# Patient Record
Sex: Female | Born: 1965 | Race: Black or African American | Hispanic: No | Marital: Married | Smoking: Never smoker
Health system: Southern US, Community
[De-identification: ages and names within clinical notes are randomized; demographics above are authoritative.]

## PROBLEM LIST (undated history)

## (undated) DIAGNOSIS — D869 Sarcoidosis, unspecified: Secondary | ICD-10-CM

## (undated) DIAGNOSIS — E785 Hyperlipidemia, unspecified: Secondary | ICD-10-CM

## (undated) DIAGNOSIS — M1990 Unspecified osteoarthritis, unspecified site: Secondary | ICD-10-CM

## (undated) DIAGNOSIS — I2510 Atherosclerotic heart disease of native coronary artery without angina pectoris: Secondary | ICD-10-CM

## (undated) DIAGNOSIS — G4730 Sleep apnea, unspecified: Secondary | ICD-10-CM

## (undated) DIAGNOSIS — E119 Type 2 diabetes mellitus without complications: Secondary | ICD-10-CM

## (undated) DIAGNOSIS — I10 Essential (primary) hypertension: Secondary | ICD-10-CM

## (undated) HISTORY — DX: Type 2 diabetes mellitus without complications: E11.9

## (undated) HISTORY — DX: Essential (primary) hypertension: I10

## (undated) HISTORY — DX: Sleep apnea, unspecified: G47.30

## (undated) HISTORY — DX: Sarcoidosis, unspecified: D86.9

## (undated) HISTORY — DX: Unspecified osteoarthritis, unspecified site: M19.90

## (undated) HISTORY — PX: HERNIA REPAIR: SHX51

## (undated) HISTORY — DX: Hyperlipidemia, unspecified: E78.5

---
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
## 2006-07-03 ENCOUNTER — Ambulatory Visit (HOSPITAL_COMMUNITY): Admission: RE | Admit: 2006-07-03 | Discharge: 2006-07-03 | Payer: Self-pay | Admitting: General Surgery

## 2007-01-09 ENCOUNTER — Ambulatory Visit: Payer: Self-pay | Admitting: Thoracic Surgery

## 2007-01-17 ENCOUNTER — Ambulatory Visit: Payer: Self-pay | Admitting: Thoracic Surgery

## 2007-01-17 ENCOUNTER — Ambulatory Visit (HOSPITAL_COMMUNITY): Admission: RE | Admit: 2007-01-17 | Discharge: 2007-01-17 | Payer: Self-pay | Admitting: Thoracic Surgery

## 2007-01-17 ENCOUNTER — Encounter: Payer: Self-pay | Admitting: Thoracic Surgery

## 2007-01-21 ENCOUNTER — Ambulatory Visit: Payer: Self-pay | Admitting: Thoracic Surgery

## 2007-02-12 ENCOUNTER — Encounter: Admission: RE | Admit: 2007-02-12 | Discharge: 2007-02-12 | Payer: Self-pay | Admitting: Thoracic Surgery

## 2007-02-12 ENCOUNTER — Ambulatory Visit: Payer: Self-pay | Admitting: Thoracic Surgery

## 2007-05-08 ENCOUNTER — Ambulatory Visit: Payer: Self-pay | Admitting: Thoracic Surgery

## 2007-11-10 ENCOUNTER — Ambulatory Visit: Payer: Self-pay | Admitting: Internal Medicine

## 2007-11-26 ENCOUNTER — Ambulatory Visit: Payer: Self-pay | Admitting: Internal Medicine

## 2008-02-18 ENCOUNTER — Ambulatory Visit: Payer: Self-pay | Admitting: Family Medicine

## 2008-07-16 IMAGING — CT CT CHEST W/ CM
2 series · 16 of 31 positions shown, 20 images · non-contrast
Comparison: none

REASON FOR EXAM: cough
COMMENTS:

[Series 2: soft tissue · axial · 0.64mm/px · z∈[-228,-148]mm · 3 of 54 slices shown]
[im 5/54  mediastinal]
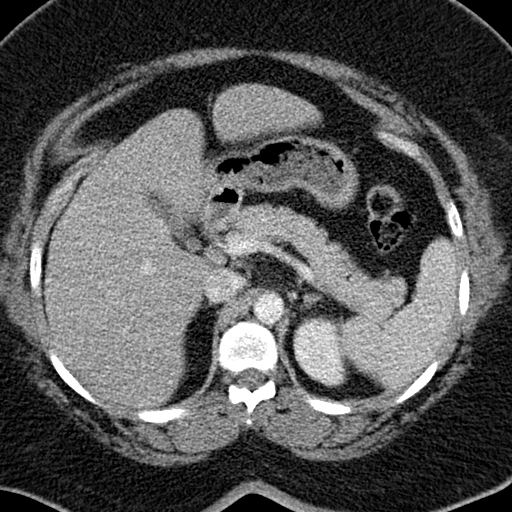
[im 13/54  mediastinal]
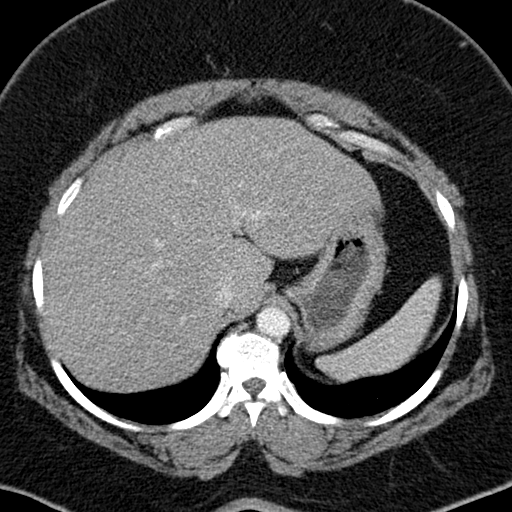
[im 21/54  mediastinal]
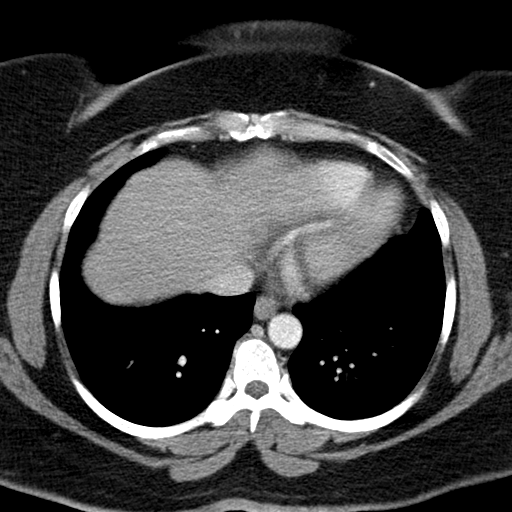

[Series 3: lung windows · axial · 0.64mm/px · z∈[-224,-4]mm · 13 of 52 slices shown, 17 images]
[im 4/52  mediastinal]
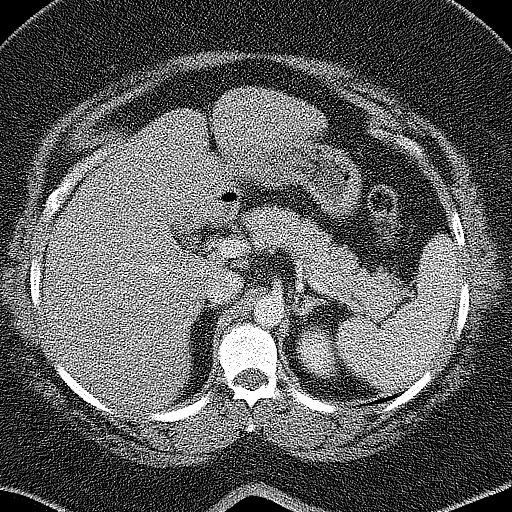
[im 4/52  lung]
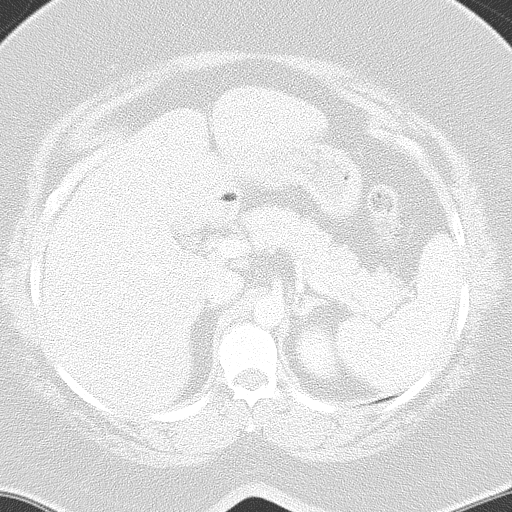
[im 8/52  lung]
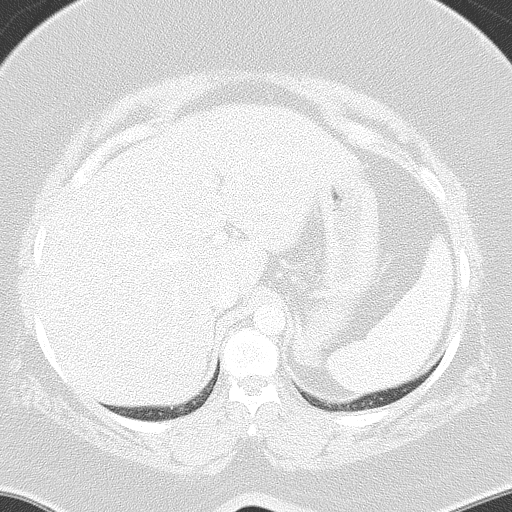
[im 12/52  lung]
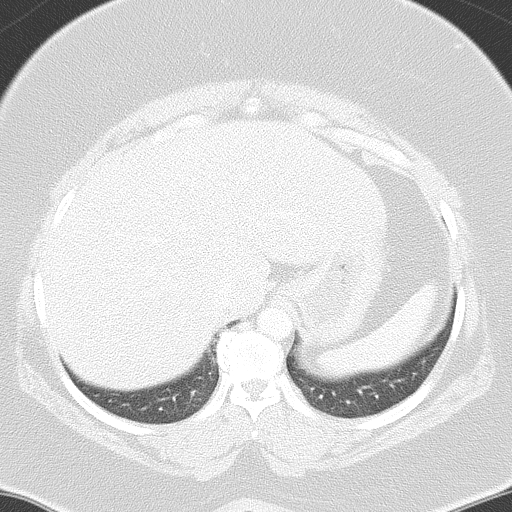
[im 16/52  lung]
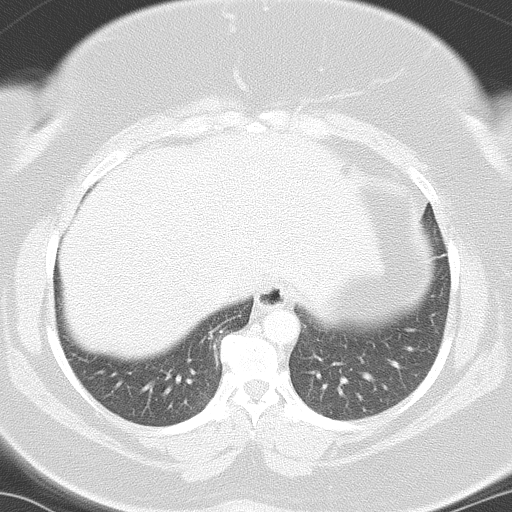
[im 20/52  mediastinal]
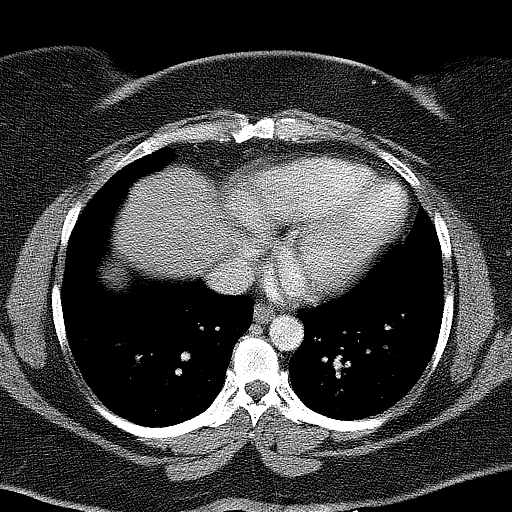
[im 20/52  lung]
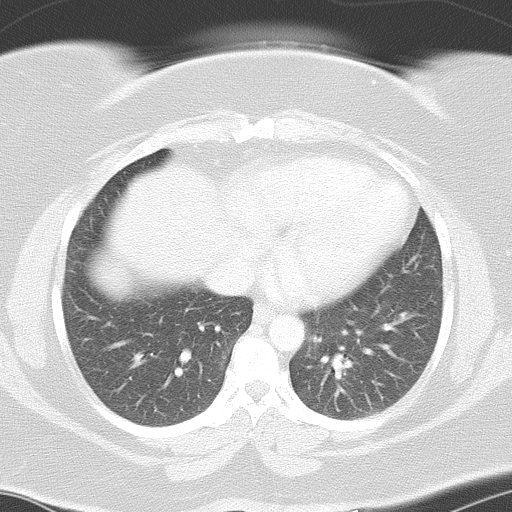
[im 24/52  lung]
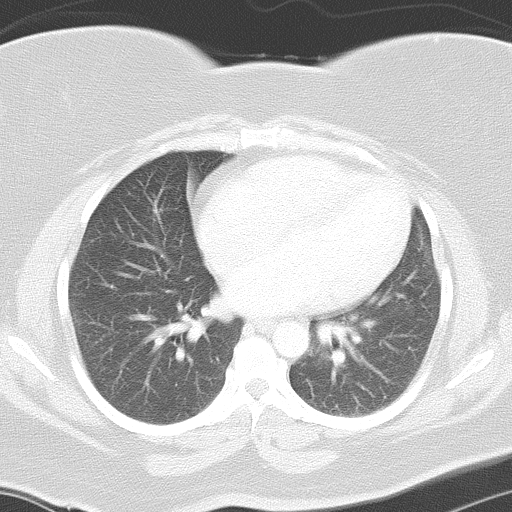
[im 26/52  lung]
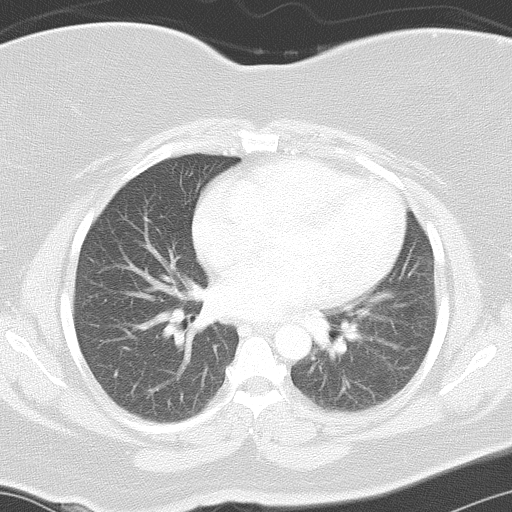
[im 28/52  lung]
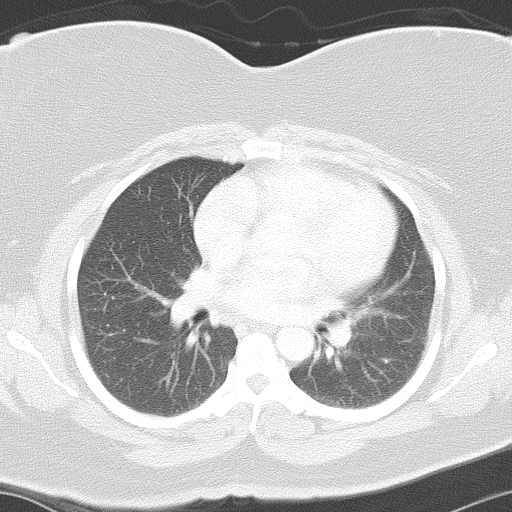
[im 32/52  mediastinal]
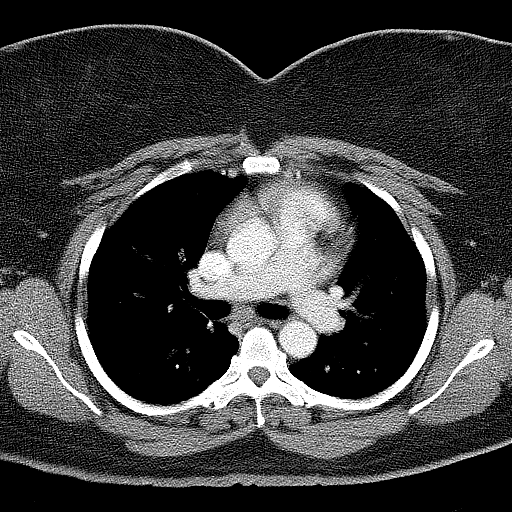
[im 32/52  lung]
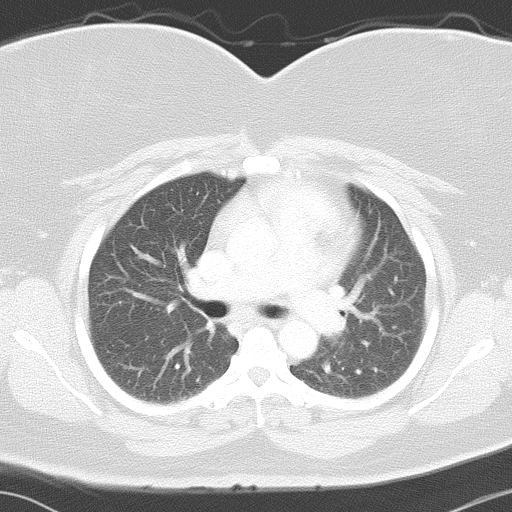
[im 36/52  lung]
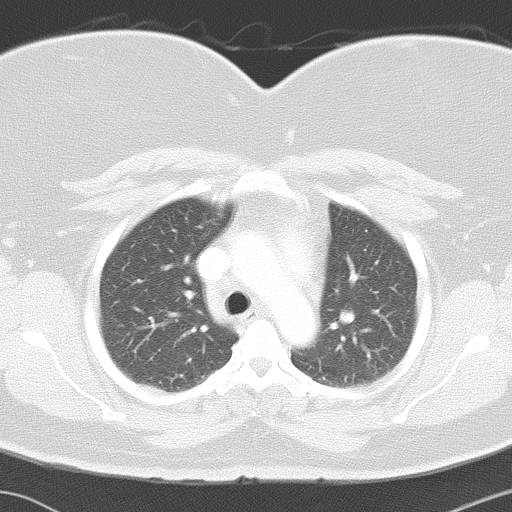
[im 40/52  lung]
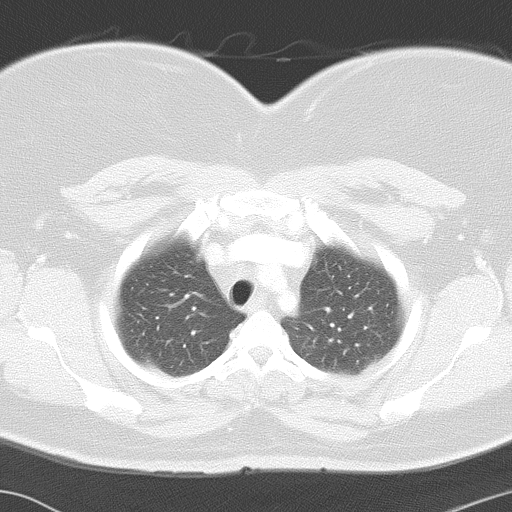
[im 44/52  lung]
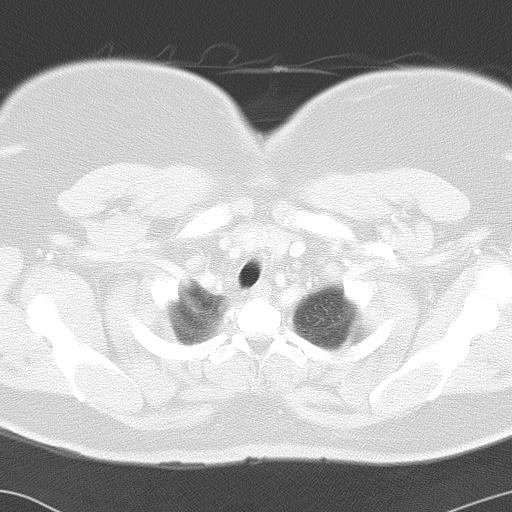
[im 48/52  mediastinal]
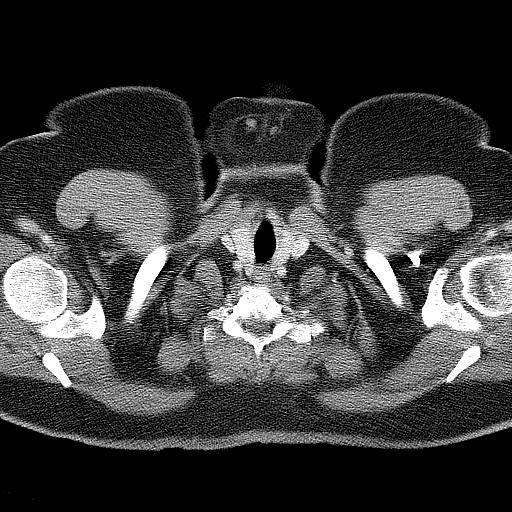
[im 48/52  lung]
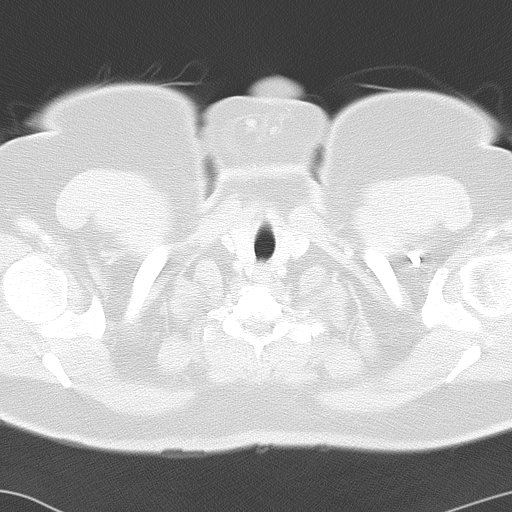

[16 of 31 positions shown; findings below may reference images not displayed]

PROCEDURE:     CT  - CT CHEST WITH CONTRAST  - November 26, 2007  [DATE]

RESULT:     Helical 5 mm sections were obtained from the thoracic inlet to
the lung bases status post intravenous administration of 100 ml of
Bsovue-V2F.

Evaluation of the mediastinum and hilar regions and structures demonstrates
no evidence of mediastinal nor hilar adenopathy nor masses. The lung
parenchyma demonstrates no evidence of focal infiltrates, effusions, edema,
masses or nodules.  The visualized upper abdominal viscera demonstrate no
gross abnormalities.
IMPRESSION: Unremarkable chest CT dated 11/26/2007.

## 2009-02-27 DIAGNOSIS — I2510 Atherosclerotic heart disease of native coronary artery without angina pectoris: Secondary | ICD-10-CM

## 2009-02-27 HISTORY — DX: Atherosclerotic heart disease of native coronary artery without angina pectoris: I25.10

## 2009-03-03 ENCOUNTER — Inpatient Hospital Stay (HOSPITAL_COMMUNITY): Admission: AD | Admit: 2009-03-03 | Discharge: 2009-03-08 | Payer: Self-pay | Admitting: Cardiology

## 2009-03-03 ENCOUNTER — Encounter: Payer: Self-pay | Admitting: Emergency Medicine

## 2009-03-28 ENCOUNTER — Encounter (HOSPITAL_COMMUNITY): Admission: RE | Admit: 2009-03-28 | Discharge: 2009-04-27 | Payer: Self-pay | Admitting: Cardiovascular Disease

## 2009-04-29 ENCOUNTER — Encounter (HOSPITAL_COMMUNITY): Admission: RE | Admit: 2009-04-29 | Discharge: 2009-05-29 | Payer: Self-pay | Admitting: Cardiovascular Disease

## 2009-05-30 ENCOUNTER — Encounter (HOSPITAL_COMMUNITY): Admission: RE | Admit: 2009-05-30 | Discharge: 2009-06-29 | Payer: Self-pay | Admitting: Cardiovascular Disease

## 2009-12-07 ENCOUNTER — Emergency Department (HOSPITAL_COMMUNITY): Admission: EM | Admit: 2009-12-07 | Discharge: 2009-12-07 | Payer: Self-pay | Admitting: Emergency Medicine

## 2010-11-04 LAB — COMPREHENSIVE METABOLIC PANEL
ALT: 31 U/L (ref 0–35)
Albumin: 3.4 g/dL — ABNORMAL LOW (ref 3.5–5.2)
Albumin: 3.6 g/dL (ref 3.5–5.2)
Alkaline Phosphatase: 89 U/L (ref 39–117)
Alkaline Phosphatase: 99 U/L (ref 39–117)
BUN: 14 mg/dL (ref 6–23)
Chloride: 106 mEq/L (ref 96–112)
Creatinine, Ser: 0.67 mg/dL (ref 0.4–1.2)
Potassium: 3.8 mEq/L (ref 3.5–5.1)
Potassium: 3.8 mEq/L (ref 3.5–5.1)
Sodium: 140 mEq/L (ref 135–145)
Total Bilirubin: 0.6 mg/dL (ref 0.3–1.2)
Total Protein: 6.7 g/dL (ref 6.0–8.3)
Total Protein: 7.2 g/dL (ref 6.0–8.3)

## 2010-11-04 LAB — CBC
HCT: 32.2 % — ABNORMAL LOW (ref 36.0–46.0)
HCT: 34.5 % — ABNORMAL LOW (ref 36.0–46.0)
HCT: 35.5 % — ABNORMAL LOW (ref 36.0–46.0)
HCT: 35.7 % — ABNORMAL LOW (ref 36.0–46.0)
HCT: 36.9 % (ref 36.0–46.0)
HCT: 38.4 % (ref 36.0–46.0)
HCT: 38.5 % (ref 36.0–46.0)
Hemoglobin: 11.7 g/dL — ABNORMAL LOW (ref 12.0–15.0)
Hemoglobin: 11.9 g/dL — ABNORMAL LOW (ref 12.0–15.0)
Hemoglobin: 12.4 g/dL (ref 12.0–15.0)
Hemoglobin: 12.9 g/dL (ref 12.0–15.0)
Hemoglobin: 13.2 g/dL (ref 12.0–15.0)
MCHC: 33.4 g/dL (ref 30.0–36.0)
MCHC: 33.6 g/dL (ref 30.0–36.0)
MCHC: 34.1 g/dL (ref 30.0–36.0)
MCHC: 34.5 g/dL (ref 30.0–36.0)
MCV: 92.7 fL (ref 78.0–100.0)
MCV: 93.4 fL (ref 78.0–100.0)
MCV: 93.7 fL (ref 78.0–100.0)
MCV: 94.1 fL (ref 78.0–100.0)
MCV: 94.2 fL (ref 78.0–100.0)
MCV: 94.2 fL (ref 78.0–100.0)
Platelets: 167 10*3/uL (ref 150–400)
Platelets: 185 10*3/uL (ref 150–400)
Platelets: 188 10*3/uL (ref 150–400)
Platelets: 204 10*3/uL (ref 150–400)
Platelets: 213 10*3/uL (ref 150–400)
Platelets: 215 10*3/uL (ref 150–400)
RBC: 4.14 MIL/uL (ref 3.87–5.11)
RDW: 12.8 % (ref 11.5–15.5)
RDW: 12.9 % (ref 11.5–15.5)
RDW: 12.9 % (ref 11.5–15.5)
RDW: 13 % (ref 11.5–15.5)
RDW: 13.1 % (ref 11.5–15.5)
WBC: 10.2 10*3/uL (ref 4.0–10.5)
WBC: 10.6 10*3/uL — ABNORMAL HIGH (ref 4.0–10.5)
WBC: 8.5 10*3/uL (ref 4.0–10.5)
WBC: 8.7 10*3/uL (ref 4.0–10.5)

## 2010-11-04 LAB — BASIC METABOLIC PANEL WITH GFR
CO2: 28 meq/L (ref 19–32)
Chloride: 104 meq/L (ref 96–112)
GFR calc Af Amer: >60 mL/min (ref >60)
Potassium: 3.9 meq/L (ref 3.5–5.1)
Sodium: 140 meq/L (ref 135–145)

## 2010-11-04 LAB — BASIC METABOLIC PANEL
BUN: 10 mg/dL (ref 6–23)
BUN: 11 mg/dL (ref 6–23)
BUN: 13 mg/dL (ref 6–23)
CO2: 26 mEq/L (ref 19–32)
CO2: 26 mEq/L (ref 19–32)
CO2: 29 mEq/L (ref 19–32)
Calcium: 9.6 mg/dL (ref 8.4–10.5)
Chloride: 107 mEq/L (ref 96–112)
Creatinine, Ser: 0.72 mg/dL (ref 0.4–1.2)
Creatinine, Ser: 0.75 mg/dL (ref 0.4–1.2)
GFR calc non Af Amer: >60 mL/min (ref >60)
GFR calc non Af Amer: >60 mL/min (ref >60)
Glucose, Bld: 102 mg/dL — ABNORMAL HIGH (ref 70–99)
Glucose, Bld: 110 mg/dL — ABNORMAL HIGH (ref 70–99)
Glucose, Bld: 135 mg/dL — ABNORMAL HIGH (ref 70–99)
Glucose, Bld: 96 mg/dL (ref 70–99)
Potassium: 3.8 mEq/L (ref 3.5–5.1)
Potassium: 3.9 mEq/L (ref 3.5–5.1)
Potassium: 3.9 mEq/L (ref 3.5–5.1)
Sodium: 141 mEq/L (ref 135–145)
Sodium: 142 mEq/L (ref 135–145)

## 2010-11-04 LAB — POCT CARDIAC MARKERS
CKMB, poc: 1.9 ng/mL (ref 1.0–8.0)
CKMB, poc: 17.8 ng/mL (ref 1.0–8.0)
CKMB, poc: 27.2 ng/mL (ref 1.0–8.0)
Myoglobin, poc: 144 ng/mL (ref 12–200)
Myoglobin, poc: 194 ng/mL (ref 12–200)
Myoglobin, poc: 240 ng/mL (ref 12–200)
Troponin i, poc: 0.24 ng/mL — ABNORMAL HIGH (ref 0.00–0.09)
Troponin i, poc: 0.88 ng/mL (ref 0.00–0.09)
Troponin i, poc: <0.05 ng/mL (ref 0.00–0.09)

## 2010-11-04 LAB — HEPARIN LEVEL (UNFRACTIONATED)
Heparin Unfractionated: 0.25 IU/mL — ABNORMAL LOW (ref 0.30–0.70)
Heparin Unfractionated: 0.45 IU/mL (ref 0.30–0.70)

## 2010-11-04 LAB — DIFFERENTIAL
Basophils Absolute: 0 10*3/uL (ref 0.0–0.1)
Basophils Absolute: 0 10*3/uL (ref 0.0–0.1)
Basophils Relative: 0 % (ref 0–1)
Basophils Relative: 0 % (ref 0–1)
Eosinophils Absolute: 0 10*3/uL (ref 0.0–0.7)
Eosinophils Absolute: 0.2 K/uL (ref 0.0–0.7)
Eosinophils Relative: 0 % (ref 0–5)
Eosinophils Relative: 2 % (ref 0–5)
Lymphocytes Relative: 15 % (ref 12–46)
Lymphs Abs: 1.3 10*3/uL (ref 0.7–4.0)
Monocytes Absolute: 0.3 10*3/uL (ref 0.1–1.0)
Monocytes Absolute: 0.5 K/uL (ref 0.1–1.0)
Monocytes Relative: 6 % (ref 3–12)
Neutro Abs: 6.6 K/uL (ref 1.7–7.7)
Neutrophils Relative %: 76 % (ref 43–77)

## 2010-11-04 LAB — CARDIAC PANEL(CRET KIN+CKTOT+MB+TROPI)
CK, MB: 39.6 ng/mL — ABNORMAL HIGH (ref 0.3–4.0)
Relative Index: 11.1 — ABNORMAL HIGH (ref 0.0–2.5)
Relative Index: 9 — ABNORMAL HIGH (ref 0.0–2.5)
Total CK: 337 U/L — ABNORMAL HIGH (ref 7–177)
Troponin I: 4.15 ng/mL (ref 0.00–0.06)

## 2010-11-04 LAB — APTT: aPTT: 64 seconds — ABNORMAL HIGH (ref 24–37)

## 2010-11-04 LAB — LIPID PANEL
Cholesterol: 184 mg/dL (ref 0–200)
HDL: 52 mg/dL (ref >39)
LDL Cholesterol: 118 mg/dL — ABNORMAL HIGH (ref 0–99)
Total CHOL/HDL Ratio: 3.5 RATIO

## 2010-11-04 LAB — PROTIME-INR
INR: 1 (ref 0.00–1.49)
INR: 1 (ref 0.00–1.49)

## 2010-11-04 LAB — BRAIN NATRIURETIC PEPTIDE: Pro B Natriuretic peptide (BNP): 35 pg/mL (ref 0.0–100.0)

## 2010-11-04 LAB — D-DIMER, QUANTITATIVE: D-Dimer, Quant: 0.43 ug/mL-FEU (ref 0.00–0.48)

## 2010-11-23 ENCOUNTER — Ambulatory Visit: Payer: Self-pay | Admitting: Family Medicine

## 2010-11-29 ENCOUNTER — Ambulatory Visit: Payer: Self-pay | Admitting: Family Medicine

## 2010-12-12 NOTE — Discharge Summary (Signed)
Glenda Burton, Glenda Burton              ACCOUNT NO.:  [id number]   MEDICAL RECORD NO.:  [id number]          PATIENT TYPE:  INP   LOCATION:  2508                         FACILITY:  [facility]   PHYSICIAN:  Antonieta Iba, MD   DATE OF BIRTH:  02/25/1966   DATE OF ADMISSION:  03/03/2009  DATE OF DISCHARGE:  03/08/2009                               DISCHARGE SUMMARY   DISCHARGE DIAGNOSES:  1. Non-ST elevation myocardial infarction.  2. Single vessel coronary artery disease status post cardiac      catheterization initially on March 04, 2009 by Dr. Nanetta Batty      showing a 95% left anterior descending.  She had a very tortuous      left anterior descending vessel.  It was not straightforward.  She      was heparinized over the weekend and her case was discussed with      Dr. Hazle Coca colleague and it was decided that an attempted      percutaneous coronary intervention would be done.  This was      performed by Dr. Tresa Endo on March 07, 2009.  The lesion appeared to      be significantly improved.  It was less than 50%.  Intracoronary      nitroglycerin was administered.  He tried to do fractional flow      rate.  However, because of her tortuous vessel, this was not able      to be done.  Thus, an intervention was not done and medical      treatment was advised.  3. History of sarcoidosis with prior biopsy by Dr. Edwyna Shell on January 17, 2007.  4. Chronic obstructive pulmonary disease or asthma, on Symbicort MDI.  5. History of hernia surgery.  6. History of arthritis.  7. Probable sleep apnea, unable to afford a sleep study because she      has no insurance.  8. Prior history of hypertension.  9. Morbid obesity with a BMI of 16.   LABORATORY DATA:  Sodium 141, potassium 3.9, BUN 10, creatinine 0.75,  and glucose 110.  Hemoglobin 10.8, hematocrit 32.2, WBCs 10.6, and  platelets 188.  Hemoglobin A1c was 5.6.  Total cholesterol is 184,  triglycerides 72, HDL 52, and LDL 118.  BNP was 35 on  March 03, 2009.  Point-of-care markers x1 was negative.  The second one showed a CK-MB of  17.8 and troponin of 0.24.  The third troponin CK-MB of 27 and troponin  of 0.88.  First CK-MB and troponin 358/39.6 with a troponin of 3.49,  number two was 337/30 with a troponin of 4.15, and number three was  275/19, and troponin of 3.34.  TSH was 0.867.  Chest x-ray on March 03, 2009 showed no active cardiopulmonary disease.   DISCHARGE MEDICATIONS:  1. Metoprolol 25 mg twice per day.  2. Imdur 60 mg a day.  3. Pravachol 40 mg at bedtime.  4. Naprosyn 500 mg b.i.d.  5. Symbicort 2 puffs daily.  6. Nitroglycerin 1/150 one sublingual every 5 minutes x3 as needed  for      chest pain.   FOLLOWUP:  She will follow up with Dr. Tresa Endo in [place] on March 17, 2009 at 1:45.  She was told not to do anything strenuous for 1 week and  not to drive for 1 week.   HOSPITAL COURSE:  [name] is a 45 year old African American who was  transferred from [facility] with complaints of chest pain.  She  was diagnosed with an non-ST elevation MI with elevated cardiac CK-MBs  and markers and troponins.  She was placed on heparin and the plan was  for cardiac catheterization.  This was performed on March 04, 2009 by  Dr. Allyson Sabal.  She had a high-grade LAD, but it was tortuous vessel.  She  was kept on heparin over the weekend and it was not a straightforward  case.  This case was discussed with his colleagues and it was decided to  attempt a PCI.  This was performed on March 07, 2009 by Dr. Nicki Guadalajara, however, the lesion was improved.  It was less than 50%.  He  tried to do a fractional flow rate, however, this could not be done  because of the tortuosity.  Medical treatment was advised.  She was seen  by a nutritionist because of her weight and also Cardiac Rehab.  On  March 08, 2009, she was seen by Dr. Garen Lah and considered stable for  discharge home.      Lezlie Octave,  N.P.      Antonieta Iba, MD  Electronically Signed    BB/MEDQ  D:  03/08/2009  T:  03/08/2009  Job:  [id number]

## 2010-12-12 NOTE — Letter (Signed)
January 21, 2007   Freda Munro, M.D.  [address]  [place], [place] [address]   Re:  CAMY, LEDER              DOB:  12-27-1965   Dear Dr. Welton Flakes,   I saw Ms. Hehl back after her mediastinoscopy, and we did both a  bronchoscopy and mediastinoscopy.  On bronchoscopy, there was evidence  of inflammation of the left main stem bronchus which the biopsies of  that as well as her mediastinal nodes showed noncaseating granulomas  consistent with sarcoid, so she does have lung involvement with her  sarcoidosis.  Her mediastinoscopy incision is well healed.  I will see  her back again in three weeks for further followup, and then refer her  to you for consideration of treatment.   Ines Bloomer, M.D.  Electronically Signed   DPB/MEDQ  D:  01/21/2007  T:  01/22/2007  Job:  [id number]

## 2010-12-12 NOTE — H&P (Signed)
HISTORY AND PHYSICAL EXAMINATION   January 10, 2007   Re:  Glenda, Burton        DOB:  [birth year]   Dear Dr. Welton Flakes:   I appreciate the opportunity of seeing Glenda Burton.  This 45 year old  African American female was evaluated for shortness of breath as well as  a 2 month cough.  A chest x-ray showed that she had extensive  mediastinal and hilar adenopathy.  She has had no hemoptysis, excessive  sputum.  She does not smoke.  She gets shortness of breath with  exertion.  There is no chest pain.  She has had no weight loss, no fever  or chills.  She is referred here for evaluation of her mediastinal  adenopathy.   PAST MEDICAL HISTORY:  She has no allergies.   MEDICATIONS:  Hydrocodone.  She has also been treated with  Avelox.   PAST SURGICAL HISTORY:  She has had a previous hernia repair.   SOCIAL HISTORY:  She works as a Psychiatric nurse .  She is married and has  2 children.  Does not smoke and does not drink.   FAMILY HISTORY:  Negative for vascular disease or cancer.   REVIEW OF SYSTEMS:  She is 268 pounds.  She is 5' 2.  CARDIAC: no angina  or atrial fibrillation.  PULMONARY:  See history of present illness.  GI:  No nausea, vomiting, constipation, or diarrhea.  GU:  No dysuria or frequent urination or kidney disease.  VASCULAR:  No claudication, DVT, TIA's.  NEUROLOGICAL:  No headaches, black out seizures, or dizziness.  MUSCULOSKELETAL:  No arthritis, joint pain, or rash.  PSYCHIATRIC:  No problems with depression or nervousness  ENT:  No change in her eye status or hearing.  HEMATOLOGICAL:  No problems with bleeding or clotting disorders or  anemia.   PHYSICAL EXAMINATION:  Blood pressure is 140/92.  Pulse 100.  Respirations 22.  SATs are 98%.  Head, eyes, ears, nose, and throat:  Unremarkable. Neck is supple with no thyromegaly.  There is no  supraclavicular axial adenopathy.  Chest: Clear to auscultation and  percussion.  No wheezing.  Heart:   Regular sinus rhythm with no murmur.  Abdomen:  Soft. There is no hepatosplenomegaly.  Extremities:  Pulses  are 2 plus.  There is no clubbing or edema.  Neurologic:  Intact.   I feel that from her chest x-ray, that sarcoidosis is the most logical  choice for diagnosis, but lymphoma needs to be ruled out as it is fairly  extensive adenopathy.  I have recommended that she get a bronchoscopy  and mediastinoscopy and we have scheduled it for her on the 17th at [facility].   I appreciate the opportunity of taking care of Glenda Burton.  I will let  you know our findings.   Sincerely,   Ines Bloomer, M.D.  Electronically Signed   DPB/MEDQ  D:  01/10/2007  T:  01/11/2007  Job:  [id number]   cc:   Freda Munro

## 2010-12-12 NOTE — Letter (Signed)
February 12, 2007   Glenda Burton  [address]  [place], [place] [address]   Re:  [name], [name]              DOB:  04-16-1966   Dear Dr. Welton Flakes:   I saw the patient back.  Her medial sternotomy incision is well-healed.  As you know she has sarcoidosis with fairly severe involvement since her  pulmonary tree was involved.  Apparently, you started her on prednisone.  Her blood pressure is 146/88, pulse 96, respirations 18, sats 96%.  From  my standpoint, she is doing well.  Her chest x-ray was stable.  I will  be happy to see her again if she has any future problems.   Ines Bloomer, M.D.  Electronically Signed   DPB/MEDQ  D:  02/12/2007  T:  02/13/2007  Job:  [id number]

## 2010-12-12 NOTE — Cardiovascular Report (Signed)
NAMECATERA, HANKINS              ACCOUNT NO.:  [id number]   MEDICAL RECORD NO.:  [id number]          PATIENT TYPE:  INP   LOCATION:  2910                         FACILITY:  [facility]   PHYSICIAN:  Nanetta Batty, M.D.   DATE OF BIRTH:  September 08, 1965   DATE OF PROCEDURE:  03/04/2009  DATE OF DISCHARGE:                            CARDIAC CATHETERIZATION   Ms. Totzke is a 45 year old morbidly overweight African American  female, admitted with a non-ST segment elevation myocardial infarction  on August 5.  Other problems include COPD, sarcoidosis, and probable  sleep apnea.  She had nonspecific ST and T-wave changes and positive CK-  MBs.  She presents now for diagnostic coronary arteriography, which was  initially set up via the right radial approach because of her body  habitus and groin anatomy.   DESCRIPTION OF PROCEDURE:  The patient was brought to the second floor  [facility] in the postabsorptive state.  She was  premedicated with p.o. Valium, IV Versed and fentanyl.  Her right radial  and right femoral arteries were prepped and shaved in usual sterile  fashion.  Xylocaine 1% was used for local anesthesia.  A 5-French Terumo  sheath was inserted into the right radial artery using standard back  wall pullback technique without difficulty.  The patient received 4000  units of heparin intravenously followed by a total of 7.5 mL of radial  antispasmodic cocktail.  Attempts were made to perform angiography  using a Tiger catheter and JL3.5; however, because of the patient's arch  anatomy, I was unable to engage the left main with a Tiger or get a  Jacky catheter or FL3.5 into the ascending aorta.  This approach was  then abandoned, and the right femoral approach was used using a 5-French  sheath.  A 5-French left to right Judkins diagnostic catheter as well as  5-French pigtail catheter were used for selective coronary angiography,  left ventriculography, subselective left  internal mammary artery  angiography, and distal abdominal aortography.  Visipaque dye was used  for the entirety of the case.  Retrograde aortic, left ventricular, and  pullback pressures were recorded.   HEMODYNAMICS:  1. Aortic systolic pressure 122, diastolic pressure 86.  2. Left ventricular systolic pressure 119, end-diastolic pressure 20.   SELECTIVE CORONARY ANGIOGRAPHY:  1. Left main normal.  The left main was a long vessel.  2. LAD; LAD had marked tortuosity especially in the proximal and      distal segments.  There was a 95% segmental stenosis at the      junction of the mid and distal LAD on a bend.  The LAD gave off a      small diagonal branch.  3. Left circumflex; dominant with marked tortuosity and free of      significant disease.  There were 2 ramus branches noted that were      moderate in size and free of significant disease.  4. Right coronary artery nondominant and free of significant disease.  5. Left ventriculography; RAO left ventriculogram was performed using      25 mL of  Visipaque dye at 12 mL per second.  The overall LVEF was      estimated greater than 60% without focal wall motion abnormalities.  6. Left internal mammary artery; this vessel subselectively visualized      was widely patent.  Suitable for use during coronary bypass      grafting if necessary.  7. Distal abdominal aortography; distal abdominal aortogram was      performed using 25 mL of Visipaque dye at 20 mL per second.  The      renal arteries are widely patent.  Infrarenal abdominal aorta and      iliac bifurcation were free of significant atherosclerotic changes.   IMPRESSION:  Ms. Crass has high-grade distal left anterior descending  disease with marked tortuosity proximal to this.  I suspect this would  be a technically difficult case, and I am going to review the angiograms  with my colleagues prior to making recommendation with regards to  medical therapy versus PCI and  stenting.   The right radial sheath was removed, and a TR Band was placed on the  wrist with 13 mL of air.  This achieved hemostasis.  The right femoral  sheath was removed, and pressure was held on the groin and achieved  hemostasis after an ACT was measured.  The patient left the lab in  stable condition.      Nanetta Batty, M.D.  Electronically Signed     JB/MEDQ  D:  03/04/2009  T:  03/04/2009  Job:  [id number]   cc:   Second Floor [facility]  [facility]

## 2010-12-12 NOTE — Cardiovascular Report (Signed)
NAMELATESSA, TILLIS              ACCOUNT NO.:  [id number]   MEDICAL RECORD NO.:  [id number]          PATIENT TYPE:  INP   LOCATION:  2508                         FACILITY:  [facility]   PHYSICIAN:  Nicki Guadalajara, M.D.     DATE OF BIRTH:  1966-07-30   DATE OF PROCEDURE:  DATE OF DISCHARGE:                            CARDIAC CATHETERIZATION   INDICATIONS:  [name] is a 45 year old female who has a his  history of significant obesity, sarcoidosis, sleep apnea.  She presented  to [facility] with chest pain and had mild  positive cardiac markers.  She became painfree, but ultimately was  transferred to [facility] on the evening of March 03, 2009.  She had nonspecific ST changes.  On March 04, 2009, she underwent  cardiac catheterization by Dr. Allyson Sabal, which showed a markedly tortuous  LAD especially in the proximal and distal segments.  There was a  suggestion of a mid distal 95% segmental stenosis at the junction of the  mid and distal LAD on sharp bend in the vessel.  The circumflex vessel  and ramus intermediate vessel were normal as well as the right coronary  artery, which was nondominant.  Due to dye load considerations since the  initial catheterization had been started with a radial approach, but was  unable to cannulate the left system through that approach.  The patient  was ultimately switched to a radial approach.  She was stabilized over  the weekend, hydrated.  She is now referred for possible intervention to  the LAD system.   PROCEDURE:  After premedication with Valium 2 mg plus fentanyl 50 mcg,  the patient was prepped and draped in usual fashion.  She was very obese  and the right femoral artery was punctured anteriorly and a 6-French  sheath was inserted.  Initially, an XB LAD 4.0 catheter was used, but  this was then exchanged for 6-French XB LAD 3.5 for optimal backup.  Multiple views of the left coronary system seem to suggest  there was  significant improvement in that mid distal LAD lesion on a tortuous bend  in the vessel, which was felt to be 90-95% on March 04, 2009.  In most  views today, this appeared to be less than 50%.  IC nitroglycerin 200  mcg was administered.  At this point, the decision was made rather than  to do intervention, but to verify the physiologic significance of this  lesion.  FFR was felt to provide this data.  As noted above, the vessel  was markedly tortuous.  At the start of the procedure, the patient had  received Angiomax and also had received Plavix 600 mg.  Initially, when  there was plans to do an intervention due to marked tortuosity, I was  going to use an extra-support wire or Asahi medium wire.  However, since  it was not totally sure an intervention was necessary, the FFR wire was  then inserted.  Unfortunately, this only comes in only one soft wire.  With some difficulty, the wire was able to cross the tortuous proximal  LAD segment, but was able to get into the LAD and cross the very  proximal segment, but this required the proximal and secondary bend on  this wire.  Ultimately, the wire was able to be advanced to the very  tortuous mid distal LAD lesion, but due to inadequate stiffness to the  wire was never able to cross this area of tortuosity.  At this point,  after some manipulation, the decision was made to discontinue the  procedure so as not to induce wire trauma to the area and potentially  make the lesion worse.  At the end of the procedure, the lesion looked  the same and the decision was made therefore to treat medically and not  intervene.  The patient tolerated the procedure well.  Hemostasis  obtained by direct manual pressure.   HEMODYNAMIC DATA:  Central aortic pressure is 126/78.   ANGIOGRAPHIC DATA:  Left main coronary was a large normal vessel, which  trifurcated into a very tortuous LAD system, a very large ramus  intermediate vessel, and a large  dominant left circumflex vessel.  The  LAD had a very sharp angle arising from the left main and then had  significant vessel tortuosity proximally with a large loop in the LAD.  In the mid distal LAD, there was marked tortuosity and in the area which  was felt to be 95% on the catheterization of August 6, most views  suggested this less than 50%.  In one view this perhaps at most 50-60%.  Again as noted above, FFR was attempted, but due to the softness of the  wire, the wire was always ultimately able to reach the lesion, but was  never able to go beyond the lesion such that an FFR was.  Although the  system had been normalized, was never able to be done distal to the  lesion.  Angiographically, the lesion appeared similar after the attempt  of FFR.  Medical therapy will be recommended.   IMPRESSION:  Single vessel coronary obstructive disease with improvement  in the previously noted 95% mid distal LAD lesion, now appeared less  than or equal to 50%.  Attempted fractional flow reserve was ultimately  discontinued since the wire was never able to cross this tortuous mid  distal LAD segment with the lesion.   RECOMMENDATIONS:  Medical therapy.           ______________________________  Nicki Guadalajara, M.D.     TK/MEDQ  D:  03/07/2009  T:  03/08/2009  Job:  [id number]

## 2010-12-12 NOTE — Op Note (Signed)
Glenda Burton, Glenda Burton              ACCOUNT NO.:  [id number]   MEDICAL RECORD NO.:  [id number]          PATIENT TYPE:  AMB   LOCATION:  SDS                          FACILITY:  [facility]   PHYSICIAN:  Ines Bloomer, M.D. DATE OF BIRTH:  05-Oct-1965   DATE OF PROCEDURE:  01/17/2007  DATE OF DISCHARGE:                               OPERATIVE REPORT   PREOPERATIVE DIAGNOSIS:  Mediastinal adenopathy.   POSTOPERATIVE DIAGNOSIS:  Mediastinal adenopathy.   OPERATION:  Fiberoptic bronchoscopy and mediastinoscopy.   After general anesthesia, the patient was prepped and draped in the  usual sterile manner. The video bronchoscope was passed through the  endotracheal tube.  The carina was in the midline.  There was a lot of  erythema and bronchitis. On the left side, there was a lot of  inflammation along the lining and multiple biopsies were taken of this  small nodular inflammation.  On the right side, there was a little  inflammation but much less. Cultures and washings were also taken.  The  video bronchoscope was removed.  The anterior neck was prepped and  draped in the usual sterile manner.  A transverse incision was made  above the sternal notch and carried down with electrocautery through the  subcutaneous tissue and fascia.  The pretracheal fascia was entered and  biopsies of 2R were done.  The strap muscles were closed with 2-0  Vicryl, subcutaneous tissue with 3-0 Vicryl and Dermabond for the skin.  The patient was returned to the recovery room in stable condition.      Ines Bloomer, M.D.  Electronically Signed     DPB/MEDQ  D:  01/17/2007  T:  01/17/2007  Job:  [id number]

## 2010-12-12 NOTE — H&P (Signed)
NAMESURIYAH, Glenda Burton              ACCOUNT NO.:  [id number]   MEDICAL RECORD NO.:  [id number]          PATIENT TYPE:  INP   LOCATION:  2910                         FACILITY:  [facility]   PHYSICIAN:  Sheliah Mends, MD      DATE OF BIRTH:  1966/03/22   DATE OF ADMISSION:  03/03/2009  DATE OF DISCHARGE:                              HISTORY & PHYSICAL   Glenda Burton is a 45 year old African American female patient who was  transferred from [facility] with complaints of chest pain.  Her  cardiac enzymes point of care markers were elevated.  Thus. she was  transferred here.  Her EKG did not show an acute MI.  Thus she is a non-  ST elevation MI.  She has no prior cardiac/coronary artery disease  history.  She states that she was at her daughter's house and this  morning about 8:30 a.m. she started having chest discomfort that  radiated to her bilateral arms.  She describes it as a tightness or an  achy feeling.  It was rated 8/10.  She does have chronic shortness of  breath because of sarcoidosis.  She may have had some mild increasing  shortness of breath.  She had no nausea, no vomiting and no diaphoresis.  The discomfort lasted for a few hours.  Her daughter apparently at a  similar time fell through a bathtub in the house and the EMS was called  and they were both taken to the hospital.  There at [facility]  apparently her first point of care markers were negative.  However, the  second set of point of care markers showed a CK-MB of 17.8 and a  troponin of 0.24.  She was placed on IV heparin and our service was  called for transfer.  She does state that on Sunday she started feeling  bad.  She felt weak but she had no chest pain and has not had any of  this chest pain previously.   PRIOR MEDICAL HISTORY:  1. Includes morbid obesity.  Her weight is 144 kg.  Her height is 5      feet 1 inch.  Her BMI is 60.  2. Hypertension.  3. Probable sleep apnea.  She was to have a sleep study,  but she has      no insurance.  4. She has some type of arthritis.  She does not know what type.  She      was on prednisone for close to a year.  She gained too much weight      and is now off of it and is on nonsteroidal anti-inflammatories.  5. COPD or asthma on Symbicort MDI.  6. She has had two C-sections.  7. Hernia surgery.  8. She has some sarcoidosis and has had a biopsy by Dr. Edwyna Shell for      mediastinal adenopathy on January 17, 2007.  Apparently per the note      she has severe involvement of her lungs.  Apparently her pulmonary      tree was involved per Dr. Scheryl Darter note.  MEDICATIONS:  1. Naprosyn 500 mg b.i.d.  2. Hydrochlorothiazide 12.5 mg once daily.  3. Symbicort inhaler, unknown dose 2 puffs b.i.d.   ALLERGIES:  No known drug allergies.   FAMILY HISTORY:  Mother is alive at age 9, has diabetes.  Father is  alive, has emphysema age 60.  She does have one brother who had an MI at  age 71.  She has four brothers all together and one twin sister.   SOCIAL HISTORY:  She is married and has two children.  She has applied  for disability.  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  However, she lost her job approximately 2 years ago when the  business was sent [place].  She has actually been going to school  taking one class at a time.  She does not smoke and does not drink any  alcohol.  She does not go to the store.  She is unable to now because  she is short of breath.   REVIEW OF SYSTEMS:  Positive leg discomfort, leg weakness, questionable  claudication.  No presyncope, no syncope.  Positive tachy palpitations  which she sits down for and will resolve.  Positive lower extremity  edema.  No nausea, no vomiting.  No anorexia,  no indigestion, no  heartburn.  No black tarry stools.  No dysuria.  No unilateral weakness.  No CVA symptoms.  Positive chest discomfort as described in HPI.  Other  systems are negative.   Blood pressure is 89/47.   Heart rate is 90-100, respirations of 22.  She  has O2 on.   LABORATORIES:  1. D-dimer was 0.43.  Sodium was 140, potassium 3.9, chloride 104, CO2      was 28, glucose 135, BUN 13, creatinine 0.72, hemoglobin was 13.2,      hematocrit 38.4.  WBCs 8.7 and platelets are 204.  Point of care      marker:  Troponin-I was less than 0.05.  CK-MB was 1.9.  2. CK-MB was 17.8, troponin 0.24.  3. CK-MB 27.2 and troponin of 0.88.  4. Chest x-ray showed no active cardiopulmonary disease.   EXAMINATION:  GENERAL:  No acute distress.  HEENT:  Pupils equal, round.  No thyromegaly, no carotid bruits and no  adenopathy.  She wears dentures, partial.  She states she has  approximately 10 teeth remaining.  She wears glasses.  Respirations are clear to auscultation bilaterally with good inspiratory  effort.  CARDIAC:  Heart sounds are rapid.  S1, S2 present.  No murmur or gallop  is noted.  ABDOMEN:  Obese.  No organomegaly could be detected.  Soft, nontender.  She does have 1+ lower extremity edema.  She has good dorsalis pedis  pulses.  She moves all extremities x4.  NEURO:  Alert and oriented x3.  SKIN:  Warm and dry.   ASSESSMENT:  1. Non-ST elevation myocardial infarction.  2. Morbid obesity.  3. Hypertension.  4. Sarcoidosis.  5. Unknown cholesterol.  6. Premature family history of heart disease.  7. Chronic obstructive pulmonary disease.  8. Probable sleep apnea.  Unable to afford a sleep study test.   PLAN:  1. She is admitted.  She is already on IV heparin from [facility].  2. Her blood pressure is too low at this time to add any cardiac      medications.  3. She will be placed on aspirin.  4. Plan is  to have her undergo cardiac catheterization.  Decision      about radial versus groin stick will be made in the morning.      Lezlie Octave, N.P.      Sheliah Mends, MD  Electronically Signed    BB/MEDQ  D:  03/03/2009  T:  03/03/2009  Job:  [phone]

## 2011-05-16 LAB — BASIC METABOLIC PANEL
BUN: 11
Calcium: 9.5
Chloride: 104
Creatinine, Ser: 0.71
GFR calc Af Amer: >60

## 2011-05-16 LAB — CULTURE, RESPIRATORY W GRAM STAIN

## 2011-05-16 LAB — TISSUE CULTURE: Culture: NO GROWTH

## 2011-05-16 LAB — CBC
MCHC: 33.3
MCV: 90.7
Platelets: 252
RBC: 4.33
WBC: 6

## 2011-05-16 LAB — AFB CULTURE WITH SMEAR (NOT AT ARMC)

## 2011-05-16 LAB — FUNGUS CULTURE W SMEAR: Fungal Smear: NONE SEEN

## 2011-05-16 LAB — PROTIME-INR
INR: 1
Prothrombin Time: 12.8

## 2011-05-16 LAB — TYPE AND SCREEN: ABO/RH(D): B POS

## 2011-08-07 ENCOUNTER — Ambulatory Visit: Payer: Self-pay | Admitting: Family Medicine

## 2013-01-05 ENCOUNTER — Ambulatory Visit: Payer: Self-pay | Admitting: Family Medicine

## 2013-01-12 ENCOUNTER — Ambulatory Visit: Payer: Self-pay | Admitting: Family Medicine

## 2013-08-04 ENCOUNTER — Ambulatory Visit: Payer: Self-pay | Admitting: Surgery

## 2014-08-12 ENCOUNTER — Ambulatory Visit: Payer: Self-pay | Admitting: Family Medicine

## 2017-10-07 ENCOUNTER — Telehealth: Payer: Self-pay

## 2017-10-07 ENCOUNTER — Encounter: Payer: Self-pay | Admitting: Internal Medicine

## 2017-10-07 ENCOUNTER — Ambulatory Visit: Payer: Medicare HMO | Admitting: Internal Medicine

## 2017-10-07 VITALS — BP 168/78 | HR 130 | Resp 16 | Ht 61.0 in | Wt 354.6 lb

## 2017-10-07 DIAGNOSIS — R0602 Shortness of breath: Secondary | ICD-10-CM | POA: Diagnosis not present

## 2017-10-07 DIAGNOSIS — G4733 Obstructive sleep apnea (adult) (pediatric): Secondary | ICD-10-CM | POA: Diagnosis not present

## 2017-10-07 DIAGNOSIS — D869 Sarcoidosis, unspecified: Secondary | ICD-10-CM

## 2017-10-07 DIAGNOSIS — R000 Tachycardia, unspecified: Secondary | ICD-10-CM | POA: Diagnosis not present

## 2017-10-07 DIAGNOSIS — Z9989 Dependence on other enabling machines and devices: Secondary | ICD-10-CM | POA: Diagnosis not present

## 2017-10-07 DIAGNOSIS — E119 Type 2 diabetes mellitus without complications: Secondary | ICD-10-CM | POA: Insufficient documentation

## 2017-10-07 DIAGNOSIS — J449 Chronic obstructive pulmonary disease, unspecified: Secondary | ICD-10-CM

## 2017-10-07 DIAGNOSIS — E785 Hyperlipidemia, unspecified: Secondary | ICD-10-CM | POA: Insufficient documentation

## 2017-10-07 DIAGNOSIS — J4489 Other specified chronic obstructive pulmonary disease: Secondary | ICD-10-CM

## 2017-10-07 DIAGNOSIS — M1990 Unspecified osteoarthritis, unspecified site: Secondary | ICD-10-CM | POA: Insufficient documentation

## 2017-10-07 DIAGNOSIS — I10 Essential (primary) hypertension: Secondary | ICD-10-CM | POA: Insufficient documentation

## 2017-10-07 DIAGNOSIS — G4730 Sleep apnea, unspecified: Secondary | ICD-10-CM | POA: Insufficient documentation

## 2017-10-07 NOTE — Patient Instructions (Signed)

## 2017-10-07 NOTE — Progress Notes (Signed)
[facility] [address] [place], [place] [address]  Pulmonary Sleep Medicine  Office Visit Note  Patient Name: Glenda Burton DOB: 01-07-1966 MRN [id number]  Date of Service: 10/07/2017  Complaints/HPI: She is doing OK overall. Her pressure was elevated she sees [facility] for her PCP. Patient states no admissions to the hospital. She had a Download done on her CPAP which shows 97% compliance. Has noted some increase in her SOB of late. Cough is noted and she states some sputum does come up at times. No chest pain noted. No fevers noted. Denies any hemoptysis  ROS  General: (-) fever, (-) chills, (-) night sweats, (-) weakness Skin: (-) rashes, (-) itching,. Eyes: (-) visual changes, (-) redness, (-) itching. Nose and Sinuses: (-) nasal stuffiness or itchiness, (-) postnasal drip, (-) nosebleeds, (-) sinus trouble. Mouth and Throat: (-) sore throat, (-) hoarseness. Neck: (-) swollen glands, (-) enlarged thyroid, (-) neck pain. Respiratory: + cough, (-) bloody sputum, + shortness of breath, - wheezing. Cardiovascular: - ankle swelling, (-) chest pain. Lymphatic: (-) lymph node enlargement. Neurologic: (-) numbness, (-) tingling. Psychiatric: (-) anxiety, (-) depression   Current Medication: Outpatient Encounter Medications as of 10/07/2017  Medication Sig  . Albuterol Sulfate (PROVENTIL HFA IN) Inhale 2 puffs into the lungs every other day as needed.  Marland Kitchen aspirin EC 81 MG tablet Take 81 mg by mouth daily.  . benzonatate (TESSALON) 100 MG capsule Take 100 mg by mouth 3 (three) times daily as needed for cough.  . cholecalciferol (VITAMIN D) 1000 units tablet Take 1,000 Units by mouth daily.  . Multiple Vitamin (DAILY VITE PO) Take by mouth.  Marland Kitchen atorvastatin (LIPITOR) 40 MG tablet   . fluticasone (FLONASE) 50 MCG/ACT nasal spray   . lisinopril (PRINIVIL,ZESTRIL) 20 MG tablet   . metoprolol tartrate (LOPRESSOR) 50 MG tablet   . nitroGLYCERIN (NITROSTAT) 0.4 MG SL  tablet    No facility-administered encounter medications on file as of 10/07/2017.     Surgical History: History reviewed. No pertinent surgical history.  Medical History: Past Medical History:  Diagnosis Date  . Arthritis   . Diabetes (HCC)   . Hyperlipidemia   . Hypertension   . Sarcoid   . Sleep apnea     Family History: Family History  Problem Relation Age of Onset  . Hypertension Mother   . Diabetes Mother   . COPD Father     Social History: Social History   Socioeconomic History  . Marital status: Married    Spouse name: Not on file  . Number of children: Not on file  . Years of education: Not on file  . Highest education level: Not on file  Social Needs  . Financial resource strain: Not on file  . Food insecurity - worry: Not on file  . Food insecurity - inability: Not on file  . Transportation needs - medical: Not on file  . Transportation needs - non-medical: Not on file  Occupational History  . Not on file  Tobacco Use  . Smoking status: Never Smoker  . Smokeless tobacco: Never Used  Substance and Sexual Activity  . Alcohol use: No    Frequency: Never  . Drug use: No  . Sexual activity: Not on file  Other Topics Concern  . Not on file  Social History Narrative  . Not on file    Vital Signs: Blood pressure (!) 168/78, pulse (!) 130, resp. rate 16, height 5\' 1"  (1.549 m), weight (!) 354  lb 9.6 oz (160.8 kg), SpO2 98 %.  Examination: General Appearance: The patient is well-developed, well-nourished, and in no distress. Skin: Gross inspection of skin unremarkable. Head: normocephalic, no gross deformities. Eyes: no gross deformities noted. ENT: ears appear grossly normal no exudates. Neck: Supple. No thyromegaly. No LAD. Respiratory: Scattered rhonchi. Cardiovascular: Normal S1 and S2 without murmur or rub. Extremities: No cyanosis. pulses are equal. Neurologic: Alert and oriented. No involuntary movements.  LABS: No results found for  this or any previous visit (from the past 2160 hour(s)).  Radiology: Mm Add Views Bil Br Bccp Scr ([facility] Hx)  Result Date: 08/12/2014 * PRIOR REPORT IMPORTED FROM AN EXTERNAL SYSTEM * CLINICAL DATA:  Screening. EXAM: DIGITAL SCREENING BILATERAL MAMMOGRAM WITH CAD COMPARISON:  Previous exam(s). ACR Breast Density Category b: There are scattered areas of fibroglandular density. FINDINGS: There are no findings suspicious for malignancy. Images were processed with CAD. IMPRESSION: No mammographic evidence of malignancy. A result letter of this screening mammogram will be mailed directly to the patient. RECOMMENDATION: Screening mammogram in one year. (Code:SM-B-01Y) BI-RADS CATEGORY  1: Negative. Electronically Signed   By: Gordan PaymentSteve  Reid M.D.   On: 08/12/2014 17:20     No results found.  No results found.    Assessment and Plan: Patient Active Problem List   Diagnosis Date Noted  . Arthritis 10/07/2017  . Diabetes (HCC) 10/07/2017  . Hyperlipidemia 10/07/2017  . Sarcoid 10/07/2017  . Sleep apnea 10/07/2017  . Hypertension 10/07/2017    1. COPD/Asthma She is on inhalers and will continue Also discussed f/u PFT due to history of Sarcoid also and increase in her symptoms  2. OSA She is on CPAP and has excellent compliance  3. Obesity Discussed wih her weight loss at length  4. Sarcoid Currently not on anything. Has increased SOB noted ?related to sarcoid She had a CT done 10 years ago unremarkable I would suggest as above f/u PFT and consider f/u CT chest  5. Tachycardia Unclear etiology would check ECG and also would followup with echo perhaps afterCT and PFT   General Counseling: I have discussed the findings of the evaluation and examination with [facility].  I have also discussed any further diagnostic evaluation thatmay be needed or ordered today. Glenda Burton verbalizes understanding of the findings of todays visit. We also reviewed her medications today and discussed drug interactions  and side effects including but not limited excessive drowsiness and altered mental states. We also discussed that there is always a risk not just to her but also people around her. she has been encouraged to call the office with any questions or concerns that should arise related to todays visit.    Time spent: 15min  I have personally obtained a history, examined the patient, evaluated laboratory and imaging results, formulated the assessment and plan and placed orders.    Yevonne PaxSaadat A Madiha Bambrick, MD [facility] Pulmonary and Critical Care Sleep medicine

## 2017-10-07 NOTE — Telephone Encounter (Signed)
Patient switching cpap supply company from [facility] and will [facility]. Dr Welton Flakeskhan gave pt new RX today for supplies/ BR

## 2017-10-15 ENCOUNTER — Telehealth: Payer: Self-pay

## 2017-10-15 NOTE — Telephone Encounter (Signed)
Left message advising patient that she is scheduled for her Ct chest on 10/23/17 @ 8:30 KP/Titania

## 2017-10-23 ENCOUNTER — Ambulatory Visit: Payer: Medicare HMO | Admitting: Internal Medicine

## 2017-10-23 ENCOUNTER — Ambulatory Visit
Admission: RE | Admit: 2017-10-23 | Discharge: 2017-10-23 | Disposition: A | Discharge status: Home / Self Care | Payer: Medicare HMO | Source: Ambulatory Visit | Attending: Internal Medicine | Admitting: Internal Medicine

## 2017-10-23 DIAGNOSIS — R0602 Shortness of breath: Secondary | ICD-10-CM | POA: Diagnosis not present

## 2017-10-23 DIAGNOSIS — D869 Sarcoidosis, unspecified: Secondary | ICD-10-CM | POA: Insufficient documentation

## 2017-10-23 MED ORDER — IOPAMIDOL (ISOVUE-370) INJECTION 76%
75.0000 mL | Freq: Once | INTRAVENOUS | Status: AC | PRN
  Administered 2017-10-23: 75 mL via INTRAVENOUS

## 2017-11-07 ENCOUNTER — Ambulatory Visit: Payer: Medicare HMO | Admitting: Internal Medicine

## 2017-11-07 ENCOUNTER — Encounter: Payer: Self-pay | Admitting: Internal Medicine

## 2017-11-07 VITALS — BP 132/98 | HR 89 | Ht 61.0 in | Wt 354.4 lb

## 2017-11-07 DIAGNOSIS — Z9989 Dependence on other enabling machines and devices: Secondary | ICD-10-CM | POA: Diagnosis not present

## 2017-11-07 DIAGNOSIS — R0602 Shortness of breath: Secondary | ICD-10-CM | POA: Diagnosis not present

## 2017-11-07 DIAGNOSIS — G4733 Obstructive sleep apnea (adult) (pediatric): Secondary | ICD-10-CM | POA: Diagnosis not present

## 2017-11-07 DIAGNOSIS — D869 Sarcoidosis, unspecified: Secondary | ICD-10-CM

## 2017-11-07 NOTE — Procedures (Signed)
[facility] [address] [place], [id number]  DATE OF SERVICE:  October 23, 2017  Complete Pulmonary Function Testing Interpretation:  FINDINGS:   the forced vital capacity is normal FEV1 is normal FEV1 FVC ratio is normal.  Post bronchodilator no significant improvement in the FEV1 however clinical improvement may occur in the absence of spirometer completed min.  Total in capacity is mildly decreased by body box and increased by nitrogen washout the residual volume is increased residual volume total lung capacity ratio is increased FRC is increased TLC is mildly decreased DLCO is mildly decreased  IMPRESSION:   this pulmonary function study shows evidence of restrictive lung disease likely related to the patient's morbid obesity.  This would be based on the body plethysmography that was done.  The nitrogen washout study shows increased lung volumes. Post bronchodilator there is no significant improvement in the FEV1 1 however clinical improvement may occur in the absence is pertinent improvement and does not preclude the use of bronchodilators DLCO is mildly decreased  Yevonne PaxSaadat A Khan, MD [facility] Pulmonary Critical Care Medicine Sleep Medicine

## 2017-11-07 NOTE — Patient Instructions (Signed)
Obesity, Adult Obesity is having too much body fat. If you have a BMI of 30 or more, you are obese. BMI is a number that explains how much body fat you have. Obesity is often caused by taking in (consuming) more calories than your body uses. Obesity can cause serious health problems. Changing your lifestyle can help to treat obesity. Follow these instructions at home: Eating and drinking   Follow advice from your doctor about what to eat and drink. Your doctor may tell you to: ? Cut down on (limit) fast foods, sweets, and processed snack foods. ? Choose low-fat options. For example, choose low-fat milk instead of whole milk. ? Eat 5 or more servings of fruits or vegetables every day. ? Eat at home more often. This gives you more control over what you eat. ? Choose healthy foods when you eat out. ? Learn what a healthy portion size is. A portion size is the amount of a certain food that is healthy for you to eat at one time. This is different for each person. ? Keep low-fat snacks available. ? Avoid sugary drinks. These include soda, fruit juice, iced tea that is sweetened with sugar, and flavored milk. ? Eat a healthy breakfast.  Drink enough water to keep your pee (urine) clear or pale yellow.  Do not go without eating for long periods of time (do not fast).  Do not go on popular or trendy diets (fad diets). Physical Activity  Exercise often, as told by your doctor. Ask your doctor: ? What types of exercise are safe for you. ? How often you should exercise.  Warm up and stretch before being active.  Do slow stretching after being active (cool down).  Rest between times of being active. Lifestyle  Limit how much time you spend in front of your TV, computer, or video game system (be less sedentary).  Find ways to reward yourself that do not involve food.  Limit alcohol intake to no more than 1 drink a day for nonpregnant women and 2 drinks a day for men. One drink equals 12 oz  of beer, 5 oz of wine, or 1 oz of hard liquor. General instructions  Keep a weight loss journal. This can help you keep track of: ? The food that you eat. ? The exercise that you do.  Take over-the-counter and prescription medicines only as told by your doctor.  Take vitamins and supplements only as told by your doctor.  Think about joining a support group. Your doctor may be able to help with this.  Keep all follow-up visits as told by your doctor. This is important. Contact a doctor if:  You cannot meet your weight loss goal after you have changed your diet and lifestyle for 6 weeks. This information is not intended to replace advice given to you by your health care provider. Make sure you discuss any questions you have with your health care provider. Document Released: 10/08/2011 Document Revised: 12/22/2015 Document Reviewed: 05/04/2015 Elsevier Interactive Patient Education  2018 [employer]  

## 2017-11-07 NOTE — Progress Notes (Signed)
[facility] [address] [place], [place] [id number]  Pulmonary Sleep Medicine   Office Visit Note  Patient Name: Glenda CatchingRegina W Burton DOB: 1966/03/07 MRN [id number]  Date of Service: 11/07/2017  Complaints/HPI:  She is doing fairly well here for follow-up after pulmonary functions were done the spirometry is actually within normal limits lung volumes were also within normal limits.  She has also had CT scan of the chest done which showed no active disease.  She is doing well with her sleep apnea has been using her CPAP device as prescribed with [facility].  I think at this point her main problem is really the issue with we can patient is to work aggressively on weight  ROS  General: (-) fever, (-) chills, (-) night sweats, (-) weakness Skin: (-) rashes, (-) itching,. Eyes: (-) visual changes, (-) redness, (-) itching. Nose and Sinuses: (-) nasal stuffiness or itchiness, (-) postnasal drip, (-) nosebleeds, (-) sinus trouble. Mouth and Throat: (-) sore throat, (-) hoarseness. Neck: (-) swollen glands, (-) enlarged thyroid, (-) neck pain. Respiratory: - cough, (-) bloody sputum, + shortness of breath, - wheezing. Cardiovascular: - ankle swelling, (-) chest pain. Lymphatic: (-) lymph node enlargement. Neurologic: (-) numbness, (-) tingling. Psychiatric: (-) anxiety, (-) depression   Current Medication: Outpatient Encounter Medications as of 11/07/2017  Medication Sig  . Albuterol Sulfate (PROVENTIL HFA IN) Inhale 2 puffs into the lungs every other day as needed.  Marland Kitchen. aspirin EC 81 MG tablet Take 81 mg by mouth daily.  Marland Kitchen. atorvastatin (LIPITOR) 40 MG tablet   . benzonatate (TESSALON) 100 MG capsule Take 100 mg by mouth 3 (three) times daily as needed for cough.  . cholecalciferol (VITAMIN D) 1000 units tablet Take 1,000 Units by mouth daily.  . fluticasone (FLONASE) 50 MCG/ACT nasal spray   . lisinopril (PRINIVIL,ZESTRIL) 20 MG tablet   . metoprolol tartrate (LOPRESSOR) 50 MG  tablet   . Multiple Vitamin (DAILY VITE PO) Take by mouth.  . nitroGLYCERIN (NITROSTAT) 0.4 MG SL tablet    No facility-administered encounter medications on file as of 11/07/2017.     Surgical History: Past Surgical History:  Procedure Laterality Date  . CESAREAN SECTION    . HERNIA REPAIR      Medical History: Past Medical History:  Diagnosis Date  . Arthritis   . Diabetes (HCC)   . Hyperlipidemia   . Hypertension   . Sarcoid   . Sleep apnea     Family History: Family History  Problem Relation Age of Onset  . Hypertension Mother   . Diabetes Mother   . COPD Father     Social History: Social History   Socioeconomic History  . Marital status: Married    Spouse name: Not on file  . Number of children: Not on file  . Years of education: Not on file  . Highest education level: Not on file  Occupational History  . Not on file  Social Needs  . Financial resource strain: Not on file  . Food insecurity:    Worry: Not on file    Inability: Not on file  . Transportation needs:    Medical: Not on file    Non-medical: Not on file  Tobacco Use  . Smoking status: Never Smoker  . Smokeless tobacco: Never Used  Substance and Sexual Activity  . Alcohol use: No    Frequency: Never  . Drug use: No  . Sexual activity: Not on file  Lifestyle  . Physical activity:  Days per week: Not on file    Minutes per session: Not on file  . Stress: Not on file  Relationships  . Social connections:    Talks on phone: Not on file    Gets together: Not on file    Attends religious service: Not on file    Active member of club or organization: Not on file    Attends meetings of clubs or organizations: Not on file    Relationship status: Not on file  . Intimate partner violence:    Fear of current or ex partner: Not on file    Emotionally abused: Not on file    Physically abused: Not on file    Forced sexual activity: Not on file  Other Topics Concern  . Not on file   Social History Narrative  . Not on file    Vital Signs: Blood pressure (!) 132/98, pulse 89, height 5\' 1"  (1.549 m), weight (!) 354 lb 6.4 oz (160.8 kg), SpO2 96 %.  Examination: General Appearance: The patient is well-developed, well-nourished, and in no distress. Skin: Gross inspection of skin unremarkable. Head: normocephalic, no gross deformities. Eyes: no gross deformities noted. ENT: ears appear grossly normal no exudates. Neck: Supple. No thyromegaly. No LAD. Respiratory: no rhonchi. Cardiovascular: Normal S1 and S2 without murmur or rub. Extremities: No cyanosis. pulses are equal. Neurologic: Alert and oriented. No involuntary movements.  LABS: No results found for this or any previous visit (from the past 2160 hour(s)).  Radiology: Ct Chest W Contrast  Result Date: 10/23/2017 CLINICAL DATA:  Cough, shortness of breath. EXAM: CT CHEST WITH CONTRAST TECHNIQUE: Multidetector CT imaging of the chest was performed during intravenous contrast administration. CONTRAST:  75mL ISOVUE-370 IOPAMIDOL (ISOVUE-370) INJECTION 76% COMPARISON:  Radiographs of March 03, 2009. CT scan of November 26, 2007. FINDINGS: Cardiovascular: No significant vascular findings. Normal heart size. No pericardial effusion. Mediastinum/Nodes: No enlarged mediastinal, hilar, or axillary lymph nodes. Thyroid gland, trachea, and esophagus demonstrate no significant findings. Lungs/Pleura: Lungs are clear. No pleural effusion or pneumothorax. Upper Abdomen: No acute abnormality. Musculoskeletal: No chest wall abnormality. No acute or significant osseous findings. IMPRESSION: No abnormality seen in the chest. Electronically Signed   By: Lupita Raider, M.D.   On: 10/23/2017 12:24    No results found.  Ct Chest W Contrast  Result Date: 10/23/2017 CLINICAL DATA:  Cough, shortness of breath. EXAM: CT CHEST WITH CONTRAST TECHNIQUE: Multidetector CT imaging of the chest was performed during intravenous contrast  administration. CONTRAST:  75mL ISOVUE-370 IOPAMIDOL (ISOVUE-370) INJECTION 76% COMPARISON:  Radiographs of March 03, 2009. CT scan of November 26, 2007. FINDINGS: Cardiovascular: No significant vascular findings. Normal heart size. No pericardial effusion. Mediastinum/Nodes: No enlarged mediastinal, hilar, or axillary lymph nodes. Thyroid gland, trachea, and esophagus demonstrate no significant findings. Lungs/Pleura: Lungs are clear. No pleural effusion or pneumothorax. Upper Abdomen: No acute abnormality. Musculoskeletal: No chest wall abnormality. No acute or significant osseous findings. IMPRESSION: No abnormality seen in the chest. Electronically Signed   By: Lupita Raider, M.D.   On: 10/23/2017 12:24      Assessment and Plan: Patient Active Problem List   Diagnosis Date Noted  . Arthritis 10/07/2017  . Diabetes (HCC) 10/07/2017  . Hyperlipidemia 10/07/2017  . Sarcoid 10/07/2017  . Sleep apnea 10/07/2017  . Hypertension 10/07/2017    1. Sarcoid stable at this time we will continue with present management no need for steroids 2. SOB I think it is combination of morbid obesity  deconditioning likely contributing to her shortness of breath.  Based on pulmonary function she is basically within normal range 3. OSA continues to show good compliance with her CPAP device which will be continued 4. Morbid obesity she was showing some interest in bariatric surgery and I referred back to primary care physician to discuss this further she would probably be a good candidate if she does not have any other comorbid conditions and from pulmonary perspective she is actually doing relatively well  General Counseling: I have discussed the findings of the evaluation and examination with Glenda Burton.  I have also discussed any further diagnostic evaluation thatmay be needed or ordered today. Glenda Burton verbalizes understanding of the findings of todays visit. We also reviewed her medications today and discussed drug  interactions and side effects including but not limited excessive drowsiness and altered mental states. We also discussed that there is always a risk not just to her but also people around her. she has been encouraged to call the office with any questions or concerns that should arise related to todays visit.    Time spent:  I have personally obtained a history, examined the patient, evaluated laboratory and imaging results, formulated the assessment and plan and placed orders.    Yevonne Pax, MD [facility] Pulmonary and Critical Care Sleep medicine

## 2017-12-05 ENCOUNTER — Other Ambulatory Visit: Payer: Self-pay | Admitting: Family Medicine

## 2017-12-05 DIAGNOSIS — Z1239 Encounter for other screening for malignant neoplasm of breast: Secondary | ICD-10-CM

## 2018-04-28 ENCOUNTER — Encounter: Payer: Self-pay | Admitting: Radiology

## 2018-04-28 ENCOUNTER — Ambulatory Visit
Admission: RE | Admit: 2018-04-28 | Discharge: 2018-04-28 | Disposition: A | Discharge status: Home / Self Care | Payer: Medicare HMO | Source: Ambulatory Visit | Attending: Family Medicine | Admitting: Family Medicine

## 2018-04-28 DIAGNOSIS — Z1231 Encounter for screening mammogram for malignant neoplasm of breast: Secondary | ICD-10-CM | POA: Diagnosis present

## 2018-04-28 DIAGNOSIS — Z1239 Encounter for other screening for malignant neoplasm of breast: Secondary | ICD-10-CM

## 2018-05-19 ENCOUNTER — Ambulatory Visit (INDEPENDENT_AMBULATORY_CARE_PROVIDER_SITE_OTHER): Payer: Medicare HMO | Admitting: Internal Medicine

## 2018-05-19 ENCOUNTER — Encounter: Payer: Self-pay | Admitting: Internal Medicine

## 2018-05-19 VITALS — BP 128/70 | HR 72 | Resp 16 | Ht 61.0 in | Wt 355.0 lb

## 2018-05-19 DIAGNOSIS — J449 Chronic obstructive pulmonary disease, unspecified: Secondary | ICD-10-CM | POA: Diagnosis not present

## 2018-05-19 DIAGNOSIS — G4733 Obstructive sleep apnea (adult) (pediatric): Secondary | ICD-10-CM

## 2018-05-19 DIAGNOSIS — Z9989 Dependence on other enabling machines and devices: Secondary | ICD-10-CM

## 2018-05-19 DIAGNOSIS — R0602 Shortness of breath: Secondary | ICD-10-CM | POA: Diagnosis not present

## 2018-05-19 DIAGNOSIS — D869 Sarcoidosis, unspecified: Secondary | ICD-10-CM | POA: Diagnosis not present

## 2018-05-19 NOTE — Progress Notes (Signed)
[facility] [address] [place], [place] [address]  Pulmonary Sleep Medicine   Office Visit Note  Patient Name: Glenda Burton DOB: [birth year] MRN [id number]  Date of Service: 05/19/2018  Complaints/HPI: Pt here for follow up on Sarcoid, and OSA.  She is generally doing well at this visit.  Her spirometry shows lung volumes that are within normal limits.  She denies any chest pain, sinus pain or pressure, palpitations or other issues.  Patient reports some mild dyspnea on exertion, which has been her norm for quite some time.  ROS  General: (-) fever, (-) chills, (-) night sweats, (-) weakness Skin: (-) rashes, (-) itching,. Eyes: (-) visual changes, (-) redness, (-) itching. Nose and Sinuses: (-) nasal stuffiness or itchiness, (-) postnasal drip, (-) nosebleeds, (-) sinus trouble. Mouth and Throat: (-) sore throat, (-) hoarseness. Neck: (-) swollen glands, (-) enlarged thyroid, (-) neck pain. Respiratory: - cough, (-) bloody sputum, - shortness of breath, + wheezing. Cardiovascular: - ankle swelling, (-) chest pain. Lymphatic: (-) lymph node enlargement. Neurologic: (-) numbness, (-) tingling. Psychiatric: (-) anxiety, (-) depression   Current Medication: Outpatient Encounter Medications as of 05/19/2018  Medication Sig  . Albuterol Sulfate (PROVENTIL HFA IN) Inhale 2 puffs into the lungs every other day as needed.  Marland Kitchen aspirin EC 81 MG tablet Take 81 mg by mouth daily.  Marland Kitchen atorvastatin (LIPITOR) 40 MG tablet   . benzonatate (TESSALON) 100 MG capsule Take 100 mg by mouth 3 (three) times daily as needed for cough.  . cholecalciferol (VITAMIN D) 1000 units tablet Take 1,000 Units by mouth daily.  . fluticasone (FLONASE) 50 MCG/ACT nasal spray   . lisinopril (PRINIVIL,ZESTRIL) 20 MG tablet   . metoprolol tartrate (LOPRESSOR) 50 MG tablet   . Multiple Vitamin (DAILY VITE PO) Take by mouth.  . nitroGLYCERIN (NITROSTAT) 0.4 MG SL tablet    No facility-administered  encounter medications on file as of 05/19/2018.     Surgical History: Past Surgical History:  Procedure Laterality Date  . CESAREAN SECTION    . HERNIA REPAIR      Medical History: Past Medical History:  Diagnosis Date  . Arthritis   . Diabetes (HCC)   . Hyperlipidemia   . Hypertension   . Sarcoid   . Sleep apnea   . Sleep apnea     Family History: Family History  Problem Relation Age of Onset  . Hypertension Mother   . Diabetes Mother   . COPD Father     Social History: Social History   Socioeconomic History  . Marital status: Married    Spouse name: Not on file  . Number of children: Not on file  . Years of education: Not on file  . Highest education level: Not on file  Occupational History  . Not on file  Social Needs  . Financial resource strain: Not on file  . Food insecurity:    Worry: Not on file    Inability: Not on file  . Transportation needs:    Medical: Not on file    Non-medical: Not on file  Tobacco Use  . Smoking status: Never Smoker  . Smokeless tobacco: Never Used  Substance and Sexual Activity  . Alcohol use: No    Frequency: Never  . Drug use: No  . Sexual activity: Not on file  Lifestyle  . Physical activity:    Days per week: Not on file    Minutes per session: Not on file  . Stress:  Not on file  Relationships  . Social connections:    Talks on phone: Not on file    Gets together: Not on file    Attends religious service: Not on file    Active member of club or organization: Not on file    Attends meetings of clubs or organizations: Not on file    Relationship status: Not on file  . Intimate partner violence:    Fear of current or ex partner: Not on file    Emotionally abused: Not on file    Physically abused: Not on file    Forced sexual activity: Not on file  Other Topics Concern  . Not on file  Social History Narrative  . Not on file    Vital Signs: Blood pressure 128/70, pulse 72, resp. rate 16, height 5\' 1"   (1.549 m), weight (!) 355 lb (161 kg), SpO2 97 %.  Examination: General Appearance: The patient is well-developed, well-nourished, and in no distress. Skin: Gross inspection of skin unremarkable. Head: normocephalic, no gross deformities. Eyes: no gross deformities noted. ENT: ears appear grossly normal no exudates. Neck: Supple. No thyromegaly. No LAD. Respiratory: Clear to auscultation. Cardiovascular: Normal S1 and S2 without murmur or rub. Extremities: No cyanosis. pulses are equal. Neurologic: Alert and oriented. No involuntary movements.  LABS: No results found for this or any previous visit (from the past 2160 hour(s)).  Radiology: Mm 3d Screen Breast Bilateral  Result Date: 04/28/2018 CLINICAL DATA:  Screening. EXAM: DIGITAL SCREENING BILATERAL MAMMOGRAM WITH TOMO AND CAD COMPARISON:  Previous exam(s). ACR Breast Density Category a: The breast tissue is almost entirely fatty. FINDINGS: There are no findings suspicious for malignancy. Images were processed with CAD. IMPRESSION: No mammographic evidence of malignancy. A result letter of this screening mammogram will be mailed directly to the patient. RECOMMENDATION: Screening mammogram in one year. (Code:SM-B-01Y) BI-RADS CATEGORY  1: Negative. Electronically Signed   By: Sande Brothers M.D.   On: 04/28/2018 15:28    No results found.  Mm 3d Screen Breast Bilateral  Result Date: 04/28/2018 CLINICAL DATA:  Screening. EXAM: DIGITAL SCREENING BILATERAL MAMMOGRAM WITH TOMO AND CAD COMPARISON:  Previous exam(s). ACR Breast Density Category a: The breast tissue is almost entirely fatty. FINDINGS: There are no findings suspicious for malignancy. Images were processed with CAD. IMPRESSION: No mammographic evidence of malignancy. A result letter of this screening mammogram will be mailed directly to the patient. RECOMMENDATION: Screening mammogram in one year. (Code:SM-B-01Y) BI-RADS CATEGORY  1: Negative. Electronically Signed   By: Sande Brothers M.D.   On: 04/28/2018 15:28      Assessment and Plan: Patient Active Problem List   Diagnosis Date Noted  . Arthritis 10/07/2017  . Diabetes (HCC) 10/07/2017  . Hyperlipidemia 10/07/2017  . Sarcoid 10/07/2017  . Sleep apnea 10/07/2017  . Hypertension 10/07/2017     1. Obstructive chronic bronchitis without exacerbation (HCC) COPD stable at this time continue with current management.  2. OSA on CPAP She continues to show good compliance with her CPAP.  She denies any issues wearing device.  Encouraged continued compliance with the machine, and discussed long-term effects of not using the machine.  3. Sarcoid Her sarcoid is stable at this time will continue with present management.  4. Morbid obesity (HCC) Many of patient's issues are exasperated by the fact that the patient is morbidly obese.  We discussed aggressive weight loss at this visit and we will continue to discuss at future visits. Obesity Counseling: Risk Assessment:  An assessment of behavioral risk factors was made today and includes lack of exercise sedentary lifestyle, lack of portion control and poor dietary habits.  Risk Modification Advice: She was counseled on portion control guidelines. Restricting daily caloric intake to. . The detrimental long term effects of obesity on her health and ongoing poor compliance was also discussed with the patient.  5. Shortness of breath Spira today showed FEV1/FVC pre-predicted percentage of 111%. - Spirometry with Graph  General Counseling: I have discussed the findings of the evaluation and examination with [facility].  I have also discussed any further diagnostic evaluation thatmay be needed or ordered today. Davonda verbalizes understanding of the findings of todays visit. We also reviewed her medications today and discussed drug interactions and side effects including but not limited excessive drowsiness and altered mental states. We also discussed that there is always a  risk not just to her but also people around her. she has been encouraged to call the office with any questions or concerns that should arise related to todays visit.    Time spent: 25 This patient was seen by Blima Ledger AGNP-C in Collaboration with Dr. Freda Munro as a part of collaborative care agreement.  I have personally obtained a history, examined the patient, evaluated laboratory and imaging results, formulated the assessment and plan and placed orders.    Yevonne Pax, MD [facility] Pulmonary and Critical Care Sleep medicine

## 2018-05-19 NOTE — Patient Instructions (Signed)

## 2018-11-24 ENCOUNTER — Other Ambulatory Visit: Payer: Self-pay

## 2018-11-24 ENCOUNTER — Ambulatory Visit (INDEPENDENT_AMBULATORY_CARE_PROVIDER_SITE_OTHER): Payer: Medicare HMO | Admitting: Adult Health

## 2018-11-24 ENCOUNTER — Encounter: Payer: Self-pay | Admitting: Adult Health

## 2018-11-24 VITALS — Ht 61.0 in | Wt 355.0 lb

## 2018-11-24 DIAGNOSIS — D869 Sarcoidosis, unspecified: Secondary | ICD-10-CM

## 2018-11-24 DIAGNOSIS — Z9989 Dependence on other enabling machines and devices: Secondary | ICD-10-CM

## 2018-11-24 DIAGNOSIS — G4733 Obstructive sleep apnea (adult) (pediatric): Secondary | ICD-10-CM | POA: Diagnosis not present

## 2018-11-24 DIAGNOSIS — J449 Chronic obstructive pulmonary disease, unspecified: Secondary | ICD-10-CM | POA: Diagnosis not present

## 2018-11-24 NOTE — Patient Instructions (Signed)

## 2018-11-24 NOTE — Progress Notes (Signed)
[facility] [address] [place], [place] [address]  Internal MEDICINE  Telephone Visit  Patient Name: Glenda Burton  [id number]  [id number]  Date of Service: 11/24/2018  I connected with the patient at 1215 by telephone and verified the patients identity using two identifiers.  I discussed the limitations, risks, security and privacy concerns of performing an evaluation and management service by telephone and the availability of in person appointments. I also discussed with the patient that there may be a patient responsible charge related to the service.  The patient expressed understanding and agrees to proceed.    Chief Complaint  Patient presents with  . Telephone Screen  . COPD    NO SOB, NO DIFFICULTIES   . Sleep Apnea    NEEDS SUPPLIES FOR CPAP   . Telephone Assessment    HPI  Pt checking in for pulmonary visit.  She reports here breathing has been doing well.  She has had no issues.  Denies fever,sob or hospitalizations since her last visit.  She reports excellent compliance with CPAP machine at night.  She is cleaning her machine as instructed.  She is also switching her mask seal and tubing as discussed.  She is in need of a new RX at this time for supplies.    Current Medication: Outpatient Encounter Medications as of 11/24/2018  Medication Sig  . Albuterol Sulfate (PROVENTIL HFA IN) Inhale 2 puffs into the lungs every other day as needed.  Marland Kitchen aspirin EC 81 MG tablet Take 81 mg by mouth daily.  Marland Kitchen atorvastatin (LIPITOR) 40 MG tablet   . cholecalciferol (VITAMIN D) 1000 units tablet Take 1,000 Units by mouth daily.  . fluticasone (FLONASE) 50 MCG/ACT nasal spray   . lisinopril (PRINIVIL,ZESTRIL) 20 MG tablet   . metoprolol tartrate (LOPRESSOR) 50 MG tablet   . Multiple Vitamin (DAILY VITE PO) Take by mouth.  . nitroGLYCERIN (NITROSTAT) 0.4 MG SL tablet   . [DISCONTINUED] benzonatate (TESSALON) 100 MG capsule Take 100 mg by mouth 3 (three) times daily as  needed for cough.   No facility-administered encounter medications on file as of 11/24/2018.     Surgical History: Past Surgical History:  Procedure Laterality Date  . CESAREAN SECTION    . HERNIA REPAIR      Medical History: Past Medical History:  Diagnosis Date  . Arthritis   . Diabetes (HCC)   . Hyperlipidemia   . Hypertension   . Sarcoid   . Sleep apnea   . Sleep apnea     Family History: Family History  Problem Relation Age of Onset  . Hypertension Mother   . Diabetes Mother   . COPD Father     Social History   Socioeconomic History  . Marital status: Married    Spouse name: Not on file  . Number of children: Not on file  . Years of education: Not on file  . Highest education level: Not on file  Occupational History  . Not on file  Social Needs  . Financial resource strain: Not on file  . Food insecurity:    Worry: Not on file    Inability: Not on file  . Transportation needs:    Medical: Not on file    Non-medical: Not on file  Tobacco Use  . Smoking status: Never Smoker  . Smokeless tobacco: Never Used  Substance and Sexual Activity  . Alcohol use: No    Frequency: Never  . Drug use: No  . Sexual  activity: Not on file  Lifestyle  . Physical activity:    Days per week: Not on file    Minutes per session: Not on file  . Stress: Not on file  Relationships  . Social connections:    Talks on phone: Not on file    Gets together: Not on file    Attends religious service: Not on file    Active member of club or organization: Not on file    Attends meetings of clubs or organizations: Not on file    Relationship status: Not on file  . Intimate partner violence:    Fear of current or ex partner: Not on file    Emotionally abused: Not on file    Physically abused: Not on file    Forced sexual activity: Not on file  Other Topics Concern  . Not on file  Social History Narrative  . Not on file      Review of Systems  Constitutional: Negative  for chills, fatigue and unexpected weight change.  HENT: Negative for congestion, rhinorrhea, sneezing and sore throat.   Eyes: Negative for photophobia, pain and redness.  Respiratory: Negative for cough, chest tightness and shortness of breath.   Cardiovascular: Negative for chest pain and palpitations.  Gastrointestinal: Negative for abdominal pain, constipation, diarrhea, nausea and vomiting.  Endocrine: Negative.   Genitourinary: Negative for dysuria and frequency.  Musculoskeletal: Negative for arthralgias, back pain, joint swelling and neck pain.  Skin: Negative for rash.  Allergic/Immunologic: Negative.   Neurological: Negative for tremors and numbness.  Hematological: Negative for adenopathy. Does not bruise/bleed easily.  Psychiatric/Behavioral: Negative for behavioral problems and sleep disturbance. The patient is not nervous/anxious.     Vital Signs: Ht 5\' 1"  (1.549 m)   Wt (!) 355 lb (161 kg)   BMI 67.08 kg/m    Observation/Objective: Well sounds, speaking in full sentences with nad noted.    Assessment/Plan: 1. OSA on CPAP Stable continue present therapy. RX for supplies placed.   2. Sarcoid Stable, continue present management.   3. Obstructive chronic bronchitis without exacerbation (HCC) Stable, continue to use inhalers as needed.   4. Morbid obesity (HCC) Obesity Counseling: Risk Assessment: An assessment of behavioral risk factors was made today and includes lack of exercise sedentary lifestyle, lack of portion control and poor dietary habits.  Risk Modification Advice: She was counseled on portion control guidelines. Restricting daily caloric intake to. . The detrimental long term effects of obesity on her health and ongoing poor compliance was also discussed with the patient.    General Counseling: Glenda Burton verbalizes understanding of the findings of today's phone visit and agrees with plan of treatment. I have discussed any further diagnostic  evaluation that may be needed or ordered today. We also reviewed her medications today. she has been encouraged to call the office with any questions or concerns that should arise related to todays visit.    No orders of the defined types were placed in this encounter.   No orders of the defined types were placed in this encounter.   Time spent:11 Minutes    Blima LedgerAdam Jenisis Harmsen AGNP-C Internal medicine

## 2018-11-25 ENCOUNTER — Telehealth: Payer: Self-pay

## 2018-11-25 NOTE — Telephone Encounter (Signed)
Gave [facility] RX for cpap supplies. Glenda Burton

## 2019-02-19 ENCOUNTER — Encounter: Payer: Self-pay | Admitting: Internal Medicine

## 2019-02-19 ENCOUNTER — Ambulatory Visit: Payer: Medicare HMO | Admitting: Internal Medicine

## 2019-02-19 ENCOUNTER — Other Ambulatory Visit: Payer: Self-pay

## 2019-02-19 VITALS — BP 131/70 | HR 81 | Ht 61.0 in | Wt 348.0 lb

## 2019-02-19 DIAGNOSIS — G4733 Obstructive sleep apnea (adult) (pediatric): Secondary | ICD-10-CM

## 2019-02-19 DIAGNOSIS — D869 Sarcoidosis, unspecified: Secondary | ICD-10-CM

## 2019-02-19 DIAGNOSIS — J449 Chronic obstructive pulmonary disease, unspecified: Secondary | ICD-10-CM

## 2019-02-19 DIAGNOSIS — R0602 Shortness of breath: Secondary | ICD-10-CM

## 2019-02-19 DIAGNOSIS — Z9989 Dependence on other enabling machines and devices: Secondary | ICD-10-CM

## 2019-02-19 NOTE — Progress Notes (Signed)
[facility] [address] [place], [place] [address]  Pulmonary Sleep Medicine   Office Visit Note  Patient Name: Glenda Burton DOB: Mar 05, 1966 MRN [id number]  Date of Service: 02/19/2019  Complaints/HPI: PT is here for follow up on OSA, COPD, and sarcoid.  She reports overall she has been doing well.  Denies any overt sob. No recent illness or hospitalizations.    ROS  General: (-) fever, (-) chills, (-) night sweats, (-) weakness Skin: (-) rashes, (-) itching,. Eyes: (-) visual changes, (-) redness, (-) itching. Nose and Sinuses: (-) nasal stuffiness or itchiness, (-) postnasal drip, (-) nosebleeds, (-) sinus trouble. Mouth and Throat: (-) sore throat, (-) hoarseness. Neck: (-) swollen glands, (-) enlarged thyroid, (-) neck pain. Respiratory: - cough, (-) bloody sputum, - shortness of breath, - wheezing. Cardiovascular: - ankle swelling, (-) chest pain. Lymphatic: (-) lymph node enlargement. Neurologic: (-) numbness, (-) tingling. Psychiatric: (-) anxiety, (-) depression   Current Medication: Outpatient Encounter Medications as of 02/19/2019  Medication Sig  . Albuterol Sulfate (PROVENTIL HFA IN) Inhale 2 puffs into the lungs every other day as needed.  Marland Kitchen. aspirin EC 81 MG tablet Take 81 mg by mouth daily.  Marland Kitchen. atorvastatin (LIPITOR) 40 MG tablet   . cholecalciferol (VITAMIN D) 1000 units tablet Take 1,000 Units by mouth daily.  . fluticasone (FLONASE) 50 MCG/ACT nasal spray   . lisinopril (PRINIVIL,ZESTRIL) 20 MG tablet   . metoprolol tartrate (LOPRESSOR) 50 MG tablet   . Multiple Vitamin (DAILY VITE PO) Take by mouth.  . nitroGLYCERIN (NITROSTAT) 0.4 MG SL tablet   . NON FORMULARY cpap device   No facility-administered encounter medications on file as of 02/19/2019.     Surgical History: Past Surgical History:  Procedure Laterality Date  . CESAREAN SECTION    . HERNIA REPAIR      Medical History: Past Medical History:  Diagnosis Date  . Arthritis    . Diabetes (HCC)   . Hyperlipidemia   . Hypertension   . Sarcoid   . Sleep apnea   . Sleep apnea     Family History: Family History  Problem Relation Age of Onset  . Hypertension Mother   . Diabetes Mother   . COPD Father     Social History: Social History   Socioeconomic History  . Marital status: Married    Spouse name: Not on file  . Number of children: Not on file  . Years of education: Not on file  . Highest education level: Not on file  Occupational History  . Not on file  Social Needs  . Financial resource strain: Not on file  . Food insecurity    Worry: Not on file    Inability: Not on file  . Transportation needs    Medical: Not on file    Non-medical: Not on file  Tobacco Use  . Smoking status: Never Smoker  . Smokeless tobacco: Never Used  Substance and Sexual Activity  . Alcohol use: No    Frequency: Never  . Drug use: No  . Sexual activity: Not on file  Lifestyle  . Physical activity    Days per week: Not on file    Minutes per session: Not on file  . Stress: Not on file  Relationships  . Social Musicianconnections    Talks on phone: Not on file    Gets together: Not on file    Attends religious service: Not on file    Active member of club  or organization: Not on file    Attends meetings of clubs or organizations: Not on file    Relationship status: Not on file  . Intimate partner violence    Fear of current or ex partner: Not on file    Emotionally abused: Not on file    Physically abused: Not on file    Forced sexual activity: Not on file  Other Topics Concern  . Not on file  Social History Narrative  . Not on file    Vital Signs: Blood pressure 131/70, pulse 81, height 5\' 1"  (1.549 m), weight (!) 348 lb (157.9 kg), SpO2 95 %.  Examination: General Appearance: The patient is well-developed, well-nourished, and in no distress. Skin: Gross inspection of skin unremarkable. Head: normocephalic, no gross deformities. Eyes: no gross  deformities noted. ENT: ears appear grossly normal no exudates. Neck: Supple. No thyromegaly. No LAD. Respiratory: clear bilateraly. Cardiovascular: Normal S1 and S2 without murmur or rub. Extremities: No cyanosis. pulses are equal. Neurologic: Alert and oriented. No involuntary movements.  LABS: No results found for this or any previous visit (from the past 2160 hour(s)).  Radiology: Mm 3d Screen Breast Bilateral  Result Date: 04/28/2018 CLINICAL DATA:  Screening. EXAM: DIGITAL SCREENING BILATERAL MAMMOGRAM WITH TOMO AND CAD COMPARISON:  Previous exam(s). ACR Breast Density Category a: The breast tissue is almost entirely fatty. FINDINGS: There are no findings suspicious for malignancy. Images were processed with CAD. IMPRESSION: No mammographic evidence of malignancy. A result letter of this screening mammogram will be mailed directly to the patient. RECOMMENDATION: Screening mammogram in one year. (Code:SM-B-01Y) BI-RADS CATEGORY  1: Negative. Electronically Signed   By: Kristopher Oppenheim M.D.   On: 04/28/2018 15:28    No results found.  No results found.    Assessment and Plan: Patient Active Problem List   Diagnosis Date Noted  . Arthritis 10/07/2017  . Diabetes ([place]) 10/07/2017  . Hyperlipidemia 10/07/2017  . Sarcoid 10/07/2017  . Sleep apnea 10/07/2017  . Hypertension 10/07/2017   1. OSA on CPAP Continue to use CPAP as prescribed.  2. Obstructive chronic bronchitis without exacerbation ([place]) Stable, continue to use medications as discussed  3. Sarcoid Stable, continue present management  4. Morbid obesity (HCC) Obesity Counseling: Risk Assessment: An assessment of behavioral risk factors was made today and includes lack of exercise sedentary lifestyle, lack of portion control and poor dietary habits.  Risk Modification Advice: She was counseled on portion control guidelines. Restricting daily caloric intake to. . The detrimental long term effects of obesity on her  health and ongoing poor compliance was also discussed with the patient.  5. SOB (shortness of breath) - Spirometry with Graph  General Counseling: I have discussed the findings of the evaluation and examination with [facility].  I have also discussed any further diagnostic evaluation thatmay be needed or ordered today. Laurian verbalizes understanding of the findings of todays visit. We also reviewed her medications today and discussed drug interactions and side effects including but not limited excessive drowsiness and altered mental states. We also discussed that there is always a risk not just to her but also people around her. she has been encouraged to call the office with any questions or concerns that should arise related to todays visit.    Time spent: 20 This patient was seen by Orson Gear AGNP-C in Collaboration with Dr. Devona Konig as a part of collaborative care agreement.   I have personally obtained a history, examined the patient, evaluated laboratory and imaging  results, formulated the assessment and plan and placed orders.    Allyne Gee, MD [facility] Pulmonary and Critical Care Sleep medicine

## 2019-03-23 ENCOUNTER — Other Ambulatory Visit: Payer: Self-pay | Admitting: Family Medicine

## 2019-03-23 DIAGNOSIS — Z1231 Encounter for screening mammogram for malignant neoplasm of breast: Secondary | ICD-10-CM

## 2019-06-18 ENCOUNTER — Telehealth: Payer: Self-pay

## 2019-06-18 NOTE — Telephone Encounter (Signed)
Called lmom informing patient of appointment. klh 

## 2019-06-22 ENCOUNTER — Ambulatory Visit: Payer: Medicare HMO | Admitting: Internal Medicine

## 2020-03-17 ENCOUNTER — Ambulatory Visit: Payer: Medicare HMO | Admitting: Internal Medicine

## 2020-03-17 ENCOUNTER — Other Ambulatory Visit: Payer: Self-pay

## 2020-03-17 ENCOUNTER — Encounter: Payer: Self-pay | Admitting: Internal Medicine

## 2020-03-17 VITALS — BP 132/88 | HR 83 | Ht 61.0 in | Wt 363.5 lb

## 2020-03-17 DIAGNOSIS — I25118 Atherosclerotic heart disease of native coronary artery with other forms of angina pectoris: Secondary | ICD-10-CM

## 2020-03-17 DIAGNOSIS — M79606 Pain in leg, unspecified: Secondary | ICD-10-CM

## 2020-03-17 DIAGNOSIS — M79604 Pain in right leg: Secondary | ICD-10-CM | POA: Diagnosis not present

## 2020-03-17 DIAGNOSIS — M79605 Pain in left leg: Secondary | ICD-10-CM

## 2020-03-17 DIAGNOSIS — E785 Hyperlipidemia, unspecified: Secondary | ICD-10-CM

## 2020-03-17 DIAGNOSIS — R0600 Dyspnea, unspecified: Secondary | ICD-10-CM | POA: Diagnosis not present

## 2020-03-17 DIAGNOSIS — R600 Localized edema: Secondary | ICD-10-CM | POA: Diagnosis not present

## 2020-03-17 DIAGNOSIS — I10 Essential (primary) hypertension: Secondary | ICD-10-CM

## 2020-03-17 DIAGNOSIS — R0609 Other forms of dyspnea: Secondary | ICD-10-CM

## 2020-03-17 DIAGNOSIS — R0602 Shortness of breath: Secondary | ICD-10-CM

## 2020-03-17 NOTE — Patient Instructions (Signed)
Medication Instructions:  Your physician recommends that you continue on your current medications as directed. Please refer to the Current Medication list given to you today.  *If you need a refill on your cardiac medications before your next appointment, please call your pharmacy*   Lab Work: none If you have labs (blood work) drawn today and your tests are completely normal, you will receive your results only by: Marland Kitchen MyChart Message (if you have MyChart) OR . A paper copy in the mail If you have any lab test that is abnormal or we need to change your treatment, we will call you to review the results.   Testing/Procedures: 1- Your physician has requested that you have an echocardiogram. Echocardiography is a painless test that uses sound waves to create images of your heart. It provides your doctor with information about the size and shape of your heart and how well your heart's chambers and valves are working. This procedure takes approximately one hour. There are no restrictions for this procedure. You may get an IV, if needed, to receive an ultrasound enhancing agent through to better visualize your heart.   2- Your physician has requested that you have an ankle brachial index (ABI). During this test an ultrasound and blood pressure cuff are used to evaluate the arteries that supply the arms and legs with blood. Allow thirty minutes for this exam. There are no restrictions or special instructions.  Your physician has requested that you have a lower extremity arterial doppler- During this test, ultrasound is used to evaluate arterial blood flow in the legs. Allow approximately one hour for this exam.    Follow-Up: At [facility], you and your health needs are our priority.  As part of our continuing mission to provide you with exceptional heart care, we have created designated Provider Care Teams.  These Care Teams include your primary Cardiologist (physician) and Advanced Practice  Providers (APPs -  Physician Assistants and Nurse Practitioners) who all work together to provide you with the care you need, when you need it.  We recommend signing up for the patient portal called "MyChart".  Sign up information is provided on this After Visit Summary.  MyChart is used to connect with patients for Virtual Visits (Telemedicine).  Patients are able to view lab/test results, encounter notes, upcoming appointments, etc.  Non-urgent messages can be sent to your provider as well.   To learn more about what you can do with MyChart, go to [email or web address].    Your next appointment:   6 week(s)  The format for your next appointment:   In Person  Provider:    You may see DR Cristal Deer END or one of the following Advanced Practice Providers on your designated Care Team:    Nicolasa Ducking, NP  Eula Listen, PA-C  Marisue Ivan, PA-C

## 2020-03-17 NOTE — Progress Notes (Signed)
New Outpatient Visit Date: 03/17/2020  Referring Provider: Abram Sander, MD [address] [place],  [place] [address]  Chief Complaint: Leg pain and swelling  HPI:  Glenda Burton is a 54 y.o. female who is being seen today for the evaluation of leg edema at the request of Glenda Burton. She has a history of coronary artery disease with borderline significant LAD by catheterization in 2010 that has been managed medically, COPD, sarcoidosis, obstructive sleep apnea, and morbid obesity. Glenda Burton reports a long history of bilateral leg pain and swelling.  In the past, her leg discomfort has improved when she has lost weight.  The pain is present when she walks or stands for any length of time.  She describes it as a pressure in both knees and calves.  She also notes some transient leg pain when she rolls in bed.  She does not have any leg wounds.  Glenda Burton was recently started on HCTZ for treatment of hypertension and edema and notes that her leg swelling has improved.  Glenda Burton reports chronic dyspnea on exertion.  She usually has to stop and catch her breath after walking about 100 yards.  She denies chest pain, palpitations, and lightheadedness.  She is complaint with CPAP.  --------------------------------------------------------------------------------------------------  Cardiovascular History & Procedures: Cardiovascular Problems:  Coronary artery disease  Shortness of breath  Leg edema/pain  Risk Factors:  No coronary artery disease, hypertension, hyperlipidemia, prediabetes, and morbid obesity  Cath/PCI:  LHC (03/04/2009): LMCA normal.  Tortuous LAD with 95% stenosis in the mid to distal vessel.  Ramus without significant disease.  Dominant LCx without significant disease.  Small, nondominant RCA without significant disease.  LVEF 60%.  LVEDP 20 mmHg.  LHC (03/07/2009): Attempted FFR of mid/distal LAD stenosis, which now appears to be 50% in severity.  Unable to advance FFR  wire beyond the lesion due to significant vessel tortuosity.  Medical management recommended.  CV Surgery:  None  EP Procedures and Devices:  None  Non-Invasive Evaluation(s):  None  --------------------------------------------------------------------------------------------------  Past Medical History:  Diagnosis Date  . Arthritis   . Coronary artery disease   . Coronary artery disease 02/2009   Moderate/severe mid LAD stenosis - medical management advised  . Diabetes (HCC)   . Hyperlipidemia   . Hypertension   . Sarcoid   . Sleep apnea   . Sleep apnea     Past Surgical History:  Procedure Laterality Date  . CESAREAN SECTION    . HERNIA REPAIR      Current Meds  Medication Sig  . ACETAMINOPHEN EXTRA STRENGTH 500 MG tablet Take 1,000 mg by mouth every 8 (eight) hours as needed.  . Albuterol Sulfate (PROVENTIL HFA IN) Inhale 2 puffs into the lungs every other day as needed.  Marland Kitchen aspirin EC 81 MG tablet Take 81 mg by mouth daily.  Marland Kitchen atorvastatin (LIPITOR) 40 MG tablet Take 40 mg by mouth daily.   . celecoxib (CELEBREX) 100 MG capsule Take 100 mg by mouth 2 (two) times daily.  . cholecalciferol (VITAMIN D) 1000 units tablet Take 1,000 Units by mouth daily.  . fluticasone (FLONASE) 50 MCG/ACT nasal spray   . Glucos-Chondroit-Hyaluron-MSM (GLUCOSAMINE CHONDROITIN JOINT PO) Take by mouth daily.  . hydrochlorothiazide (MICROZIDE) 12.5 MG capsule Take 12.5 mg by mouth daily.  . isosorbide mononitrate (IMDUR) 60 MG 24 hr tablet Take 60 mg by mouth daily.  Marland Kitchen lisinopril (PRINIVIL,ZESTRIL) 20 MG tablet Take 20 mg by mouth daily.   . metoprolol  tartrate (LOPRESSOR) 100 MG tablet Take 100 mg by mouth 2 (two) times daily.   . Multiple Vitamin (DAILY VITE PO) Take by mouth.  . nitroGLYCERIN (NITROSTAT) 0.4 MG SL tablet   . NON FORMULARY cpap device    Allergies: Patient has no known allergies.  Social History   Tobacco Use  . Smoking status: Never Smoker  . Smokeless  tobacco: Never Used  Vaping Use  . Vaping Use: Never used  Substance Use Topics  . Alcohol use: No  . Drug use: No    Family History  Problem Relation Age of Onset  . Hypertension Mother   . Diabetes Mother   . COPD Father   . Heart failure Father   . Heart failure Brother     Review of Systems: A 12-system review of systems was performed and was negative except as noted in the HPI.  --------------------------------------------------------------------------------------------------  Physical Exam: BP 132/88 (BP Location: Right Wrist, Patient Position: Sitting, Cuff Size: Normal)   Pulse 83   Ht 5\' 1"  (1.549 m)   Wt (!) 363 lb 8 oz (164.9 kg)   SpO2 97%   BMI 68.68 kg/m   General: Morbidly obese woman, seated comfortably in a wheelchair. HEENT: No conjunctival pallor or scleral icterus. Facemask in place. Neck: Supple without lymphadenopathy, thyromegaly, JVD, or HJR, though body habitus limits evaluation. No carotid bruit. Lungs: Normal work of breathing. Clear to auscultation bilaterally without wheezes or crackles. Heart: Distant heart sounds.  Regular rate and rhythm without murmurs, rubs, or gallops.  Unable to assess PMI due to body habitus. Abd: Bowel sounds present. Soft, NT/ND.  Unable to assess HSM due to body habitus. Ext: Trace to 1+ distal pretibial edema bilaterally.  Difficulty palpating pedal pulses; question body habitus. Skin: Warm and dry without rash. Neuro: CNIII-XII intact. Strength and fine-touch sensation intact in upper and lower extremities bilaterally. Psych: Normal mood and affect.  EKG: Normal sinus rhythm without abnormality.  Lab Results  Component Value Date   WBC 10.6 (H) 03/08/2009   HGB 10.8 (L) 03/08/2009   HCT 32.2 (L) 03/08/2009   MCV 94.2 03/08/2009   PLT 188 03/08/2009    Lab Results  Component Value Date   NA 141 03/08/2009   K 3.9 03/08/2009   CL 107 03/08/2009   CO2 29 03/08/2009   BUN 10 03/08/2009   CREATININE 0.75  03/08/2009   GLUCOSE 110 (H) 03/08/2009   ALT 24 03/04/2009   Outside labs (8/3.2021): CMP: Sodium 144, potassium 4.2, chloride 106, CO2 24, BUN 15, creatinine 1.0, glucose 118, calcium 10.0, AST 20, ALT 22, alkaline phosphatase 122, total bilirubin 0.4, total protein 7.0, albumin 3.9  Lipid panel: Total cholesterol 122, triglycerides 97, HDL 49, LDL 55  Hemoglobin A1c: 5.7%  TSH 2.78  --------------------------------------------------------------------------------------------------  ASSESSMENT AND PLAN: Leg pain and edema: This is likely multifactorial with contributions from venous insufficiency and musculoskeletal etiologies exacerbated by morbid obesity.  Given some exertional component to the pain, PAD is a consideration.  I recommended ABIs.  We will also obtain an echocardiogram to evaluate for heart failure, though images will likely be suboptimal given the patient's body habitus.  Dyspnea on exertion and coronary artery disease with stable angina: Longstanding and likely related to morbid obesity and deconditioning.  Patient has a history of at least moderate mid/distal LAD disease identified in 2010.  Though she does not have any angina, exertional dyspnea could be anginal equivalent.  We will begin with an echocardiogram.  If  no obvious wall motion abnormality or reduced LVEF is seen, we would need to consider myocardial PET/CT at [facility] (improved sensitivity and specificity in the setting of the patients BMI near 70.  If there is a significant WMA or reduction in LVEF, cath would need to be considered instead.  Continue current medications for antianginal therapy and secondary prevention.  Hypertension: Blood pressure borderline today.  Continue current medications and work on lifestyle modifications.  Hyperlipidemia: LDL at goal; continue atorvastatin 40 mg daily.  Morbid obesity: Weight loss encouraged through diet and exercise.  Follow-up: Return to clinic in 6  weeks.  Yvonne Kendall, MD 03/19/2020 1:17 PM

## 2020-03-19 ENCOUNTER — Encounter: Payer: Self-pay | Admitting: Internal Medicine

## 2020-03-19 DIAGNOSIS — I25118 Atherosclerotic heart disease of native coronary artery with other forms of angina pectoris: Secondary | ICD-10-CM | POA: Insufficient documentation

## 2020-03-19 DIAGNOSIS — M79605 Pain in left leg: Secondary | ICD-10-CM | POA: Insufficient documentation

## 2020-03-19 DIAGNOSIS — R600 Localized edema: Secondary | ICD-10-CM | POA: Insufficient documentation

## 2020-03-19 DIAGNOSIS — R0609 Other forms of dyspnea: Secondary | ICD-10-CM | POA: Insufficient documentation

## 2020-03-30 ENCOUNTER — Other Ambulatory Visit: Payer: Self-pay | Admitting: Family Medicine

## 2020-03-30 DIAGNOSIS — Z1231 Encounter for screening mammogram for malignant neoplasm of breast: Secondary | ICD-10-CM

## 2020-04-11 ENCOUNTER — Ambulatory Visit (INDEPENDENT_AMBULATORY_CARE_PROVIDER_SITE_OTHER): Payer: Medicare HMO

## 2020-04-11 ENCOUNTER — Other Ambulatory Visit: Payer: Self-pay

## 2020-04-11 DIAGNOSIS — R0600 Dyspnea, unspecified: Secondary | ICD-10-CM

## 2020-04-11 DIAGNOSIS — R600 Localized edema: Secondary | ICD-10-CM | POA: Diagnosis not present

## 2020-04-11 DIAGNOSIS — M79604 Pain in right leg: Secondary | ICD-10-CM

## 2020-04-11 DIAGNOSIS — M79605 Pain in left leg: Secondary | ICD-10-CM

## 2020-04-11 DIAGNOSIS — R0609 Other forms of dyspnea: Secondary | ICD-10-CM

## 2020-04-11 LAB — ECHOCARDIOGRAM COMPLETE
Area-P 1/2: 1.36 cm2
S' Lateral: 3 cm

## 2020-04-15 ENCOUNTER — Telehealth: Payer: Self-pay | Admitting: *Deleted

## 2020-04-15 NOTE — Telephone Encounter (Signed)
Left message on first number to call back.  Called second number and spoke with patient. She verbalized understanding of result and is agreeable to the myocardial PET/CT at [facility]. She is aware I will place the order and someone will call her to schedule.   While getting off the phone with patient, patient's daughter was calling back. The message was left on her phone, ok per DPR. Daughter verbalized understanding as well.

## 2020-04-15 NOTE — Telephone Encounter (Signed)
-----   Message from Glenda Kendall, MD sent at 04/14/2020  1:59 PM EDT ----- Please let Glenda Burton know that her ABI's are normal without evidence of significant blockage in either leg.  Echocardiogram is also normal.  I recommend that we arrange for Glenda Burton to have a myocardial PET/CT at [facility] to exclude significant coronary artery disease, as we discussed at her recent office visit.

## 2020-04-15 NOTE — Telephone Encounter (Signed)
Message sent to precert to obtain insurance authorization. Once received, then will fax order, authorization, demographics, etc to [facility] - Fax number [phone].

## 2020-04-19 ENCOUNTER — Telehealth: Payer: Self-pay | Admitting: Internal Medicine

## 2020-04-19 NOTE — Telephone Encounter (Signed)
Patient's Berkley Harvey has been approved. CPT- [id number] Auth# [id number] EXP - 10/12/20   All documents faxed to [facility] successfully.

## 2020-04-19 NOTE — Telephone Encounter (Signed)
Please call regarding authorization for stress test

## 2020-04-19 NOTE — Telephone Encounter (Signed)
Called Lauren and she saw where I faxed the authorization with the documents. She was appreciative and will contact patient to schedule procedure.

## 2020-05-02 ENCOUNTER — Other Ambulatory Visit: Payer: Self-pay

## 2020-05-02 ENCOUNTER — Encounter: Payer: Self-pay | Admitting: Internal Medicine

## 2020-05-02 ENCOUNTER — Ambulatory Visit (INDEPENDENT_AMBULATORY_CARE_PROVIDER_SITE_OTHER): Payer: Medicare HMO | Admitting: Internal Medicine

## 2020-05-02 VITALS — BP 120/62 | HR 85 | Ht 62.0 in | Wt 367.2 lb

## 2020-05-02 DIAGNOSIS — I25118 Atherosclerotic heart disease of native coronary artery with other forms of angina pectoris: Secondary | ICD-10-CM

## 2020-05-02 DIAGNOSIS — R600 Localized edema: Secondary | ICD-10-CM

## 2020-05-02 DIAGNOSIS — I10 Essential (primary) hypertension: Secondary | ICD-10-CM | POA: Diagnosis not present

## 2020-05-02 NOTE — Patient Instructions (Signed)
Medication Instructions:  Your physician recommends that you continue on your current medications as directed. Please refer to the Current Medication list given to you today.  *If you need a refill on your cardiac medications before your next appointment, please call your pharmacy*  Lab Work: none If you have labs (blood work) drawn today and your tests are completely normal, you will receive your results only by:  MyChart Message (if you have MyChart) OR  A paper copy in the mail If you have any lab test that is abnormal or we need to change your treatment, we will call you to review the results.  Testing/Procedures:  Myocardial PET/CT at [facility].  Someone will call you to schedule.    Follow-Up: At [facility], you and your health needs are our priority.  As part of our continuing mission to provide you with exceptional heart care, we have created designated Provider Care Teams.  These Care Teams include your primary Cardiologist (physician) and Advanced Practice Providers (APPs -  Physician Assistants and Nurse Practitioners) who all work together to provide you with the care you need, when you need it.  We recommend signing up for the patient portal called "MyChart".  Sign up information is provided on this After Visit Summary.  MyChart is used to connect with patients for Virtual Visits (Telemedicine).  Patients are able to view lab/test results, encounter notes, upcoming appointments, etc.  Non-urgent messages can be sent to your provider as well.   To learn more about what you can do with MyChart, go to [email or web address].    Your next appointment:   6 month(s)  The format for your next appointment:   In Person  Provider:   You may see DR Cristal Deer END or one of the following Advanced Practice Providers on your designated Care Team:    Nicolasa Ducking, NP  Eula Listen, PA-C  Marisue Ivan, PA-C  [place], [place]

## 2020-05-02 NOTE — Progress Notes (Signed)
Follow-up Outpatient Visit Date: 05/02/2020  Primary Care Provider: Center, [facility] Glenda [place] [address]  Chief Complaint: Follow-up shortness of breath  HPI:  Glenda Burton is a 54 y.o. female with history of coronary artery disease with borderline significant LAD by catheterization in 2010, COPD, sarcoidosis, obstructive sleep apnea, and morbid obesity, who presents for follow-up of leg swelling.  I met [name] in mid August, which time she complained of bilateral leg edema and pain.  Leg pain had improved with some weight loss.  She describes the discomfort as pressure in her knees and calves.  She also noted chronic exertional dyspnea when walking 100 yards.  Echocardiogram showed normal left ventricular systolic and diastolic function.  No significant valvular abnormality was identified.  Myocardial PET/CTA at [facility] was recommended to exclude significant ischemic heart disease (this has yet to be completed).  Today, [name] reports that she is feeling a bit better with decreased leg pain and swelling. She continues to have discomfort around her knees, left greater than right. This is most pronounced when she stands or walks. Pain has improved with addition of celecoxib. She reports good urine output with addition of HCTZ. She has stable exertional dyspnea when walking extended palpitations. She has not had any chest pain, lightheadedness, or palpitations.  --------------------------------------------------------------------------------------------------  Cardiovascular History & Procedures: Cardiovascular Problems:  Coronary artery disease  Shortness of breath  Leg edema/pain  Risk Factors:  No coronary artery disease, hypertension, hyperlipidemia, prediabetes, and morbid obesity  Cath/PCI:  LHC (03/04/2009): LMCA normal.  Tortuous LAD with 95% stenosis in the mid to distal vessel.  Ramus without significant disease.  Dominant LCx without  significant disease.  Small, nondominant RCA without significant disease.  LVEF 60%.  LVEDP 20 mmHg.  LHC (03/07/2009): Attempted FFR of mid/distal LAD stenosis, which now appears to be 50% in severity.  Unable to advance FFR wire beyond the lesion due to significant vessel tortuosity.  Medical management recommended.  CV Surgery:  None  EP Procedures and Devices:  None  Non-Invasive Evaluation(s):  TTE (04/11/2020): Normal LV size and wall thickness.  LVEF 60-65%.  Normal diastolic function.  Normal RV size and function.  No significant valvular abnormality.  Normal CVP.  ABIs (04/11/2020): Normal.  Recent CV Pertinent Labs: Lab Results  Component Value Date   CHOL  03/04/2009    184        ATP III CLASSIFICATION:  <200     mg/dL   Desirable  200-239  mg/dL   Borderline High  >=240    mg/dL   High          HDL 52 03/04/2009   LDLCALC (H) 03/04/2009    118        Total Cholesterol/HDL:CHD Risk Coronary Heart Disease Risk Table                     Men   Women  1/2 Average Risk   3.4   3.3  Average Risk       5.0   4.4  2 X Average Risk   9.6   7.1  3 X Average Risk  23.4   11.0        Use the calculated Patient Ratio above and the CHD Risk Table to determine the patient's CHD Risk.        ATP III CLASSIFICATION (LDL):  <100     mg/dL   Optimal  100-129  mg/dL  Near or Above                    Optimal  130-159  mg/dL   Borderline  160-189  mg/dL   High  >190     mg/dL   Very High   TRIG 72 03/04/2009   CHOLHDL 3.5 03/04/2009   INR 1.0 03/07/2009   K 3.9 03/08/2009   MG 1.8 03/03/2009   BUN 10 03/08/2009   CREATININE 0.75 03/08/2009    Past medical and surgical history were reviewed and updated in EPIC.  Current Meds  Medication Sig  . ACETAMINOPHEN EXTRA STRENGTH 500 MG tablet Take 1,000 mg by mouth every 8 (eight) hours as needed.  . Albuterol Sulfate (PROVENTIL HFA IN) Inhale 2 puffs into the lungs every other day as needed.  Marland Kitchen aspirin EC 81 MG tablet  Take 81 mg by mouth daily.  Marland Kitchen atorvastatin (LIPITOR) 40 MG tablet Take 40 mg by mouth daily.   . celecoxib (CELEBREX) 100 MG capsule Take 100 mg by mouth 2 (two) times daily.  . cholecalciferol (VITAMIN D) 1000 units tablet Take 1,000 Units by mouth daily.  . fluticasone (FLONASE) 50 MCG/ACT nasal spray   . Glucos-Chondroit-Hyaluron-MSM (GLUCOSAMINE CHONDROITIN JOINT PO) Take by mouth daily.  . hydrochlorothiazide (MICROZIDE) 12.5 MG capsule Take 12.5 mg by mouth daily.  . isosorbide mononitrate (IMDUR) 60 MG 24 hr tablet Take 60 mg by mouth daily.  Marland Kitchen lisinopril (PRINIVIL,ZESTRIL) 20 MG tablet Take 20 mg by mouth daily.   . metoprolol tartrate (LOPRESSOR) 100 MG tablet Take 100 mg by mouth 2 (two) times daily.   . Multiple Vitamin (DAILY VITE PO) Take by mouth.  . nitroGLYCERIN (NITROSTAT) 0.4 MG SL tablet   . NON FORMULARY cpap device    Allergies: Patient has no known allergies.  Social History   Tobacco Use  . Smoking status: Never Smoker  . Smokeless tobacco: Never Used  Vaping Use  . Vaping Use: Never used  Substance Use Topics  . Alcohol use: No  . Drug use: No    Family History  Problem Relation Age of Onset  . Hypertension Mother   . Diabetes Mother   . COPD Father   . Heart failure Father   . Heart failure Brother     Review of Systems: A 12-system review of systems was performed and was negative except as noted in the HPI.  --------------------------------------------------------------------------------------------------  Physical Exam: BP 120/62 (BP Location: Left Arm, Patient Position: Sitting, Cuff Size: Large)   Pulse 85   Ht 5' 2"  (1.575 m)   Wt (!) 367 lb 4 oz (166.6 kg)   SpO2 97%   BMI 67.17 kg/m   General: Morbidly obese woman, seated in wheelchair. Neck: No gross JVD, though body habitus limits evaluation. Lungs: Mildly diminished breath sounds throughout without wheeze or crackles. Heart: Distant heart sounds. Regular rate and rhythm without  murmurs, rubs, or gallops. Abdomen: Obese and soft. Extremities: Trace pretibial edema bilaterally.  EKG: Normal sinus rhythm without abnormality.  Lab Results  Component Value Date   WBC 10.6 (H) 03/08/2009   HGB 10.8 (L) 03/08/2009   HCT 32.2 (L) 03/08/2009   MCV 94.2 03/08/2009   PLT 188 03/08/2009    Lab Results  Component Value Date   NA 141 03/08/2009   K 3.9 03/08/2009   CL 107 03/08/2009   CO2 29 03/08/2009   BUN 10 03/08/2009   CREATININE 0.75 03/08/2009   GLUCOSE 110 (H) 03/08/2009  ALT 24 03/04/2009    Lab Results  Component Value Date   CHOL  03/04/2009    184        ATP III CLASSIFICATION:  <200     mg/dL   Desirable  200-239  mg/dL   Borderline High  >=240    mg/dL   High          HDL 52 03/04/2009   LDLCALC (H) 03/04/2009    118        Total Cholesterol/HDL:CHD Risk Coronary Heart Disease Risk Table                     Men   Women  1/2 Average Risk   3.4   3.3  Average Risk       5.0   4.4  2 X Average Risk   9.6   7.1  3 X Average Risk  23.4   11.0        Use the calculated Patient Ratio above and the CHD Risk Table to determine the patient's CHD Risk.        ATP III CLASSIFICATION (LDL):  <100     mg/dL   Optimal  100-129  mg/dL   Near or Above                    Optimal  130-159  mg/dL   Borderline  160-189  mg/dL   High  >190     mg/dL   Very High   TRIG 72 03/04/2009   CHOLHDL 3.5 03/04/2009    --------------------------------------------------------------------------------------------------  ASSESSMENT AND PLAN: Coronary artery disease with stable angina: Patient thought to have at least 50% mid LAD stenosis by catheterization in 2010. She does not have angina but has some exertional dyspnea. What I suspect morbid obesity and deconditioning are largely driving this, progressive coronary insufficiency is also a consideration. Recent echocardiogram showed preserved LVEF without wall motion abnormalities. I had recommended  proceeding with myocardial PET/CT at [facility], though it does not appear that this has been scheduled. We will try to facilitate this again. In the meantime, we will continue current medications including aspirin and atorvastatin as well as isosorbide mononitrate and metoprolol.  Leg edema: Most likely due to venous insufficiency. Recent echo showed normal systolic and diastolic function. I encouraged Ms. Depner to continue elevating her legs. We will continue with HCTZ.  Hypertension: Blood pressure well controlled today. Continue current regimen of HCTZ, isosorbide mononitrate, lisinopril, and metoprolol.  Morbid obesity: BMI remains greater than 65. I encouraged Ms. Wigington to work on [employer] loss through diet and exercise.  Follow-up: Return to clinic in 6 months.  Nelva Bush, MD 05/02/2020 4:31 PM

## 2020-05-03 ENCOUNTER — Encounter: Payer: Self-pay | Admitting: Internal Medicine

## 2020-05-03 NOTE — Telephone Encounter (Signed)
Spoke to [employer] at [facility]. She remembers [place] talking last and could not locate patient in the system. They have had some issues with faxes. She requested I resend the order and paperwork.  Paperwork refaxed successfully.

## 2020-05-10 ENCOUNTER — Telehealth: Payer: Self-pay | Admitting: Internal Medicine

## 2020-05-10 NOTE — Telephone Encounter (Signed)
Message sent to precert requesting facility change to [facility].

## 2020-05-10 NOTE — Telephone Encounter (Signed)
Received staff message that patient's Berkley Harvey has been updated with given [facility] NPI.   Inetta Fermo at [facility] notified and she will update the authorization in their system.

## 2020-05-10 NOTE — Telephone Encounter (Signed)
Authorization for myocardial profusion was approved for [facility], needs facility changed to [facility]. Please call to discuss. CPT code 24818 NPI [id number]

## 2020-05-27 ENCOUNTER — Telehealth: Payer: Self-pay | Admitting: Internal Medicine

## 2020-05-27 NOTE — Telephone Encounter (Signed)
No answer. Left detailed message with results, ok per DPR, and to call back if any questions. She is due to follow up in 6 months which would be April 2022.

## 2020-05-27 NOTE — Telephone Encounter (Signed)
Please let Glenda Burton know that her myocardial PET/CT stress test at [facility] was normal without evidence of a significant blockage.  She should continue her current medications and follow-up with [place] as previously recommended.

## 2020-05-30 NOTE — Telephone Encounter (Signed)
S/w with Glenda Burton, ok per DPR. She verbalized understanding and has her calendar marked to call back to schedule after the first of the year.

## 2020-08-15 DIAGNOSIS — G4733 Obstructive sleep apnea (adult) (pediatric): Secondary | ICD-10-CM | POA: Diagnosis not present

## 2020-09-09 DIAGNOSIS — Z23 Encounter for immunization: Secondary | ICD-10-CM | POA: Diagnosis not present

## 2020-09-09 DIAGNOSIS — E782 Mixed hyperlipidemia: Secondary | ICD-10-CM | POA: Diagnosis not present

## 2020-09-09 DIAGNOSIS — I2510 Atherosclerotic heart disease of native coronary artery without angina pectoris: Secondary | ICD-10-CM | POA: Diagnosis not present

## 2020-09-15 DIAGNOSIS — G4733 Obstructive sleep apnea (adult) (pediatric): Secondary | ICD-10-CM | POA: Diagnosis not present

## 2020-10-12 DIAGNOSIS — R7309 Other abnormal glucose: Secondary | ICD-10-CM | POA: Diagnosis not present

## 2020-10-12 DIAGNOSIS — I10 Essential (primary) hypertension: Secondary | ICD-10-CM | POA: Diagnosis not present

## 2020-10-12 DIAGNOSIS — E782 Mixed hyperlipidemia: Secondary | ICD-10-CM | POA: Diagnosis not present

## 2020-10-13 DIAGNOSIS — G4733 Obstructive sleep apnea (adult) (pediatric): Secondary | ICD-10-CM | POA: Diagnosis not present

## 2020-10-26 DIAGNOSIS — I10 Essential (primary) hypertension: Secondary | ICD-10-CM | POA: Diagnosis not present

## 2020-10-26 DIAGNOSIS — E782 Mixed hyperlipidemia: Secondary | ICD-10-CM | POA: Diagnosis not present

## 2020-11-11 ENCOUNTER — Ambulatory Visit: Payer: Medicare HMO | Admitting: Family

## 2020-11-17 DIAGNOSIS — G894 Chronic pain syndrome: Secondary | ICD-10-CM | POA: Diagnosis not present

## 2020-11-17 DIAGNOSIS — I10 Essential (primary) hypertension: Secondary | ICD-10-CM | POA: Diagnosis not present

## 2021-01-11 ENCOUNTER — Telehealth: Payer: Self-pay | Admitting: Internal Medicine

## 2021-01-11 NOTE — Telephone Encounter (Signed)
Patient daughter calling for assistance with filling out forms for [facility] .  Patient daughter needs dx or dx code for myo pet ordered by Dr. Okey Dupre.   Provided information as patient daughter on [place] .   No further needs at this time.

## 2021-02-09 DIAGNOSIS — I2510 Atherosclerotic heart disease of native coronary artery without angina pectoris: Secondary | ICD-10-CM | POA: Diagnosis not present

## 2021-02-09 DIAGNOSIS — I10 Essential (primary) hypertension: Secondary | ICD-10-CM | POA: Diagnosis not present

## 2021-02-09 DIAGNOSIS — Z139 Encounter for screening, unspecified: Secondary | ICD-10-CM | POA: Diagnosis not present

## 2021-03-10 DIAGNOSIS — Z1211 Encounter for screening for malignant neoplasm of colon: Secondary | ICD-10-CM | POA: Diagnosis not present

## 2021-03-10 DIAGNOSIS — G894 Chronic pain syndrome: Secondary | ICD-10-CM | POA: Diagnosis not present

## 2021-03-10 DIAGNOSIS — E782 Mixed hyperlipidemia: Secondary | ICD-10-CM | POA: Diagnosis not present

## 2021-03-10 DIAGNOSIS — M25561 Pain in right knee: Secondary | ICD-10-CM | POA: Diagnosis not present

## 2021-03-10 DIAGNOSIS — Z1239 Encounter for other screening for malignant neoplasm of breast: Secondary | ICD-10-CM | POA: Diagnosis not present

## 2021-03-10 DIAGNOSIS — M160 Bilateral primary osteoarthritis of hip: Secondary | ICD-10-CM | POA: Diagnosis not present

## 2021-03-10 DIAGNOSIS — Z23 Encounter for immunization: Secondary | ICD-10-CM | POA: Diagnosis not present

## 2021-03-10 DIAGNOSIS — I10 Essential (primary) hypertension: Secondary | ICD-10-CM | POA: Diagnosis not present

## 2021-03-10 DIAGNOSIS — Z0000 Encounter for general adult medical examination without abnormal findings: Secondary | ICD-10-CM | POA: Diagnosis not present

## 2021-03-10 DIAGNOSIS — R7309 Other abnormal glucose: Secondary | ICD-10-CM | POA: Diagnosis not present

## 2021-03-15 ENCOUNTER — Other Ambulatory Visit: Payer: Self-pay | Admitting: Family Medicine

## 2021-03-15 DIAGNOSIS — Z1231 Encounter for screening mammogram for malignant neoplasm of breast: Secondary | ICD-10-CM

## 2021-04-20 DIAGNOSIS — G4733 Obstructive sleep apnea (adult) (pediatric): Secondary | ICD-10-CM | POA: Diagnosis not present

## 2021-05-09 DIAGNOSIS — Z124 Encounter for screening for malignant neoplasm of cervix: Secondary | ICD-10-CM | POA: Diagnosis not present

## 2021-05-09 DIAGNOSIS — R87610 Atypical squamous cells of undetermined significance on cytologic smear of cervix (ASC-US): Secondary | ICD-10-CM | POA: Diagnosis not present

## 2021-05-15 DIAGNOSIS — R7303 Prediabetes: Secondary | ICD-10-CM | POA: Diagnosis not present

## 2021-05-15 DIAGNOSIS — J449 Chronic obstructive pulmonary disease, unspecified: Secondary | ICD-10-CM | POA: Diagnosis not present

## 2021-05-15 DIAGNOSIS — Z6841 Body Mass Index (BMI) 40.0 and over, adult: Secondary | ICD-10-CM | POA: Diagnosis not present

## 2021-05-15 DIAGNOSIS — I10 Essential (primary) hypertension: Secondary | ICD-10-CM | POA: Diagnosis not present

## 2021-05-15 DIAGNOSIS — E7800 Pure hypercholesterolemia, unspecified: Secondary | ICD-10-CM | POA: Diagnosis not present

## 2021-05-15 DIAGNOSIS — I2510 Atherosclerotic heart disease of native coronary artery without angina pectoris: Secondary | ICD-10-CM | POA: Diagnosis not present

## 2021-05-15 DIAGNOSIS — M1990 Unspecified osteoarthritis, unspecified site: Secondary | ICD-10-CM | POA: Diagnosis not present

## 2021-05-15 DIAGNOSIS — G4733 Obstructive sleep apnea (adult) (pediatric): Secondary | ICD-10-CM | POA: Diagnosis not present

## 2021-05-15 DIAGNOSIS — D869 Sarcoidosis, unspecified: Secondary | ICD-10-CM | POA: Diagnosis not present

## 2021-05-16 ENCOUNTER — Other Ambulatory Visit: Payer: Self-pay | Admitting: Family Medicine

## 2021-05-16 DIAGNOSIS — Z13820 Encounter for screening for osteoporosis: Secondary | ICD-10-CM

## 2021-05-20 DIAGNOSIS — G4733 Obstructive sleep apnea (adult) (pediatric): Secondary | ICD-10-CM | POA: Diagnosis not present

## 2021-05-23 DIAGNOSIS — D860 Sarcoidosis of lung: Secondary | ICD-10-CM | POA: Diagnosis not present

## 2021-05-23 DIAGNOSIS — G4733 Obstructive sleep apnea (adult) (pediatric): Secondary | ICD-10-CM | POA: Diagnosis not present

## 2021-05-23 DIAGNOSIS — G8929 Other chronic pain: Secondary | ICD-10-CM | POA: Diagnosis not present

## 2021-05-23 DIAGNOSIS — Z008 Encounter for other general examination: Secondary | ICD-10-CM | POA: Diagnosis not present

## 2021-05-23 DIAGNOSIS — E785 Hyperlipidemia, unspecified: Secondary | ICD-10-CM | POA: Diagnosis not present

## 2021-05-23 DIAGNOSIS — E1151 Type 2 diabetes mellitus with diabetic peripheral angiopathy without gangrene: Secondary | ICD-10-CM | POA: Diagnosis not present

## 2021-05-23 DIAGNOSIS — I10 Essential (primary) hypertension: Secondary | ICD-10-CM | POA: Diagnosis not present

## 2021-05-23 DIAGNOSIS — J449 Chronic obstructive pulmonary disease, unspecified: Secondary | ICD-10-CM | POA: Diagnosis not present

## 2021-05-23 DIAGNOSIS — Z6841 Body Mass Index (BMI) 40.0 and over, adult: Secondary | ICD-10-CM | POA: Diagnosis not present

## 2021-05-23 DIAGNOSIS — I25119 Atherosclerotic heart disease of native coronary artery with unspecified angina pectoris: Secondary | ICD-10-CM | POA: Diagnosis not present

## 2021-06-20 DIAGNOSIS — G4733 Obstructive sleep apnea (adult) (pediatric): Secondary | ICD-10-CM | POA: Diagnosis not present

## 2021-06-26 DIAGNOSIS — R7303 Prediabetes: Secondary | ICD-10-CM | POA: Diagnosis not present

## 2021-06-26 DIAGNOSIS — Z6841 Body Mass Index (BMI) 40.0 and over, adult: Secondary | ICD-10-CM | POA: Diagnosis not present

## 2021-06-26 DIAGNOSIS — I2510 Atherosclerotic heart disease of native coronary artery without angina pectoris: Secondary | ICD-10-CM | POA: Diagnosis not present

## 2021-06-26 DIAGNOSIS — G4733 Obstructive sleep apnea (adult) (pediatric): Secondary | ICD-10-CM | POA: Diagnosis not present

## 2021-07-19 DIAGNOSIS — G4733 Obstructive sleep apnea (adult) (pediatric): Secondary | ICD-10-CM | POA: Diagnosis not present

## 2021-09-18 ENCOUNTER — Encounter: Payer: Self-pay | Admitting: Specialist

## 2021-09-18 ENCOUNTER — Ambulatory Visit: Payer: Medicare HMO | Admitting: Specialist

## 2021-09-18 ENCOUNTER — Other Ambulatory Visit: Payer: Self-pay

## 2021-09-18 ENCOUNTER — Ambulatory Visit: Payer: Self-pay

## 2021-09-18 VITALS — BP 155/91 | HR 105 | Ht 60.0 in | Wt 359.0 lb

## 2021-09-18 DIAGNOSIS — Z6841 Body Mass Index (BMI) 40.0 and over, adult: Secondary | ICD-10-CM

## 2021-09-18 DIAGNOSIS — M4316 Spondylolisthesis, lumbar region: Secondary | ICD-10-CM

## 2021-09-18 DIAGNOSIS — M48061 Spinal stenosis, lumbar region without neurogenic claudication: Secondary | ICD-10-CM

## 2021-09-18 DIAGNOSIS — M1712 Unilateral primary osteoarthritis, left knee: Secondary | ICD-10-CM

## 2021-09-18 DIAGNOSIS — M1711 Unilateral primary osteoarthritis, right knee: Secondary | ICD-10-CM

## 2021-09-18 DIAGNOSIS — M5136 Other intervertebral disc degeneration, lumbar region: Secondary | ICD-10-CM | POA: Diagnosis not present

## 2021-09-18 DIAGNOSIS — G8929 Other chronic pain: Secondary | ICD-10-CM

## 2021-09-18 DIAGNOSIS — M545 Low back pain, unspecified: Secondary | ICD-10-CM

## 2021-09-18 DIAGNOSIS — M4317 Spondylolisthesis, lumbosacral region: Secondary | ICD-10-CM

## 2021-09-18 MED ORDER — GABAPENTIN 300 MG PO CAPS: 300.0000 mg | ORAL_CAPSULE | Freq: Every day | ORAL | 3 refills | Status: DC

## 2021-09-18 NOTE — Progress Notes (Signed)
Office Visit Note   Patient: Glenda Burton           Date of Birth: [birth year]           MRN: [id number] Visit Date: 09/18/2021              Requested by: Center, [facility] [place],  [place] [address] PCP: Center, [place]: Visit Diagnoses:  1. Chronic bilateral low back pain, unspecified whether sciatica present   2. Degenerative disc disease, lumbar   3. Spinal stenosis of lumbar region without neurogenic claudication   4. Spondylolisthesis, lumbar region   5. Spondylolisthesis at L5-S1 level   6. Unilateral primary osteoarthritis, left knee   7. Unilateral primary osteoarthritis, right knee   8. Body mass index 70 and over, adult [facility])     Plan: Plan: Knee is suffering from osteoarthritis, only real proven treatments are Ice the knee that is suffering from osteoarthritis, only real proven treatments are Weight loss, NSIADs like diclofenac and exercise. Well padded shoes help. Ice the knee 2-3 times a day 15-20 mins at a time.3 times a day 15-20 mins at a time. Hot showers in the AM.  Injection with steroid may be of benefit. Hemp CBD capsules, amazon.com 5,000-7,000 mg per bottle, 60 capsules per bottle, take one capsule twice a day. Cane in the left hand to use with left leg weight bearing. Follow-Up Instructions: No follow-ups on file.   Continue with the weight loss as any intervention unfortunately would be dependent on first being of a weight that is not a high risk for complications, a BMI of 40 is a good goal. Most insurance companies will not allow for  Knee replacements for patient's with a BMI over 40. You are doing the right thing to address this. In the mean time a therapy program that can help to strengthen those muscles that support the knees and the back.  Gabapentin 300 mg at night to help with sleep and we may increase this if you are able to tolerate its use.   Follow-Up Instructions: No  follow-ups on file.   Orders:  Orders Placed This Encounter  Procedures   XR Lumbar Spine 2-3 Views   No orders of the defined types were placed in this encounter.     Procedures: No procedures performed   Clinical Data: No additional findings.   Subjective: Chief Complaint  Patient presents with   Lower Back - Pain   Right Leg - Pain   Left Leg - Pain    56 year old female with a history of back and bilateral knee pain for several years. Pain in the knees is present with standing in the morning and with starting to stand and walk after sitting. No bowel or bladder difficulty. She has some SOB with walking a long ways. History of sarcoidoisis with difficulty raising her arms over her head. In 2009-10 she was  Seen at [facility] with a chronic cough. Found to have a spot on her lungs. Body mass index is 70.11 kg/m.    Review of Systems  Constitutional: Negative.   HENT: Negative.    Eyes: Negative.   Respiratory: Negative.    Cardiovascular: Negative.   Gastrointestinal: Negative.   Endocrine: Negative.   Genitourinary: Negative.   Musculoskeletal: Negative.   Skin: Negative.   Allergic/Immunologic: Negative.   Neurological: Negative.   Hematological: Negative.   Psychiatric/Behavioral: Negative.  Objective: Vital Signs: BP (!) 155/91    Pulse (!) 105    Ht 5' (1.524 m)    Wt (!) 359 lb (162.8 kg)    BMI 70.11 kg/m   Physical Exam Constitutional:      Appearance: She is well-developed.  HENT:     Head: Normocephalic and atraumatic.  Eyes:     Pupils: Pupils are equal, round, and reactive to light.  Pulmonary:     Effort: Pulmonary effort is normal.     Breath sounds: Normal breath sounds.  Abdominal:     General: Bowel sounds are normal.     Palpations: Abdomen is soft.  Musculoskeletal:     Cervical back: Normal range of motion and neck supple.     Lumbar back: Negative right straight leg raise test and negative left straight leg raise test.   Skin:    General: Skin is warm and dry.  Neurological:     Mental Status: She is alert and oriented to person, place, and time.  Psychiatric:        Behavior: Behavior normal.        Thought Content: Thought content normal.        Judgment: Judgment normal.    Back Exam   Tenderness  The patient is experiencing tenderness in the lumbar.  Range of Motion  Extension:  abnormal  Flexion:  abnormal  Lateral bend right:  abnormal  Lateral bend left:  abnormal  Rotation right:  abnormal  Rotation left:  abnormal   Muscle Strength  Right Quadriceps:  5/5  Left Quadriceps:  5/5  Right Hamstrings:  5/5  Left Hamstrings:  5/5   Tests  Straight leg raise right: negative Straight leg raise left: negative  Reflexes  Patellar:  0/4 Achilles:  0/4 Biceps:  2/4  Other  Toe walk: normal Heel walk: normal Sensation: normal Gait: normal  Erythema: no back redness Scars: absent     Specialty Comments:  No specialty comments available.  Imaging: No results found.   PMFS History: Patient Active Problem List   Diagnosis Date Noted   Morbid obesity ([place]) 05/03/2020   Leg edema 03/19/2020   Pain in both lower extremities 03/19/2020   Dyspnea on exertion 03/19/2020   Coronary artery disease of native artery of native heart with stable angina pectoris ([place]) 03/19/2020   Arthritis 10/07/2017   Diabetes ([place]) 10/07/2017   Hyperlipidemia LDL goal <70 10/07/2017   Sarcoid 10/07/2017   Sleep apnea 10/07/2017   Essential hypertension 10/07/2017   Past Medical History:  Diagnosis Date   Arthritis    Coronary artery disease    Coronary artery disease 02/2009   Moderate/severe mid LAD stenosis - medical management advised   Diabetes ([place])    Hyperlipidemia    Hypertension    Sarcoid    Sleep apnea    Sleep apnea     Family History  Problem Relation Age of Onset   Hypertension Mother    Diabetes Mother    COPD Father    Heart failure Father    Heart failure  Brother     Past Surgical History:  Procedure Laterality Date   CESAREAN SECTION     HERNIA REPAIR     Social History   Occupational History   Not on file  Tobacco Use   Smoking status: Never   Smokeless tobacco: Never  Vaping Use   Vaping Use: Never used  Substance and Sexual Activity   Alcohol use: No  Drug use: No   Sexual activity: Not on file

## 2021-09-18 NOTE — Patient Instructions (Signed)
Plan: Knee is suffering from osteoarthritis, only real proven treatments are Ice the knee that is suffering from osteoarthritis, only real proven treatments are Weight loss, NSIADs like diclofenac and exercise. Well padded shoes help. Ice the knee 2-3 times a day 15-20 mins at a time.3 times a day 15-20 mins at a time. Hot showers in the AM.  Injection with steroid may be of benefit. Hemp CBD capsules, amazon.com 5,000-7,000 mg per bottle, 60 capsules per bottle, take one capsule twice a day. Cane in the left hand to use with left leg weight bearing. Follow-Up Instructions: No follow-ups on file.   Continue with the weight loss as any intervention unfortunately would be dependent on first being of a weight that is not a high risk for complications, a BMI of 40 is a good goal. Most insurance companies will not allow for  Knee replacements for patient's with a BMI over 40. You are doing the right thing to address this. In the mean time a therapy program that can help to strengthen those muscles that support the knees and the back.  Gabapentin 300 mg at night to help with sleep and we may increase this if you are able to tolerate its use.

## 2021-10-12 ENCOUNTER — Encounter (HOSPITAL_COMMUNITY): Payer: Self-pay | Admitting: Physical Therapy

## 2021-10-12 ENCOUNTER — Other Ambulatory Visit: Payer: Self-pay

## 2021-10-12 ENCOUNTER — Ambulatory Visit (HOSPITAL_COMMUNITY): Payer: Medicare HMO | Attending: Specialist | Admitting: Physical Therapy

## 2021-10-12 DIAGNOSIS — M1711 Unilateral primary osteoarthritis, right knee: Secondary | ICD-10-CM | POA: Diagnosis not present

## 2021-10-12 DIAGNOSIS — M6281 Muscle weakness (generalized): Secondary | ICD-10-CM | POA: Insufficient documentation

## 2021-10-12 DIAGNOSIS — M48061 Spinal stenosis, lumbar region without neurogenic claudication: Secondary | ICD-10-CM | POA: Diagnosis not present

## 2021-10-12 DIAGNOSIS — M25562 Pain in left knee: Secondary | ICD-10-CM | POA: Insufficient documentation

## 2021-10-12 DIAGNOSIS — M1712 Unilateral primary osteoarthritis, left knee: Secondary | ICD-10-CM | POA: Diagnosis not present

## 2021-10-12 DIAGNOSIS — R2689 Other abnormalities of gait and mobility: Secondary | ICD-10-CM | POA: Insufficient documentation

## 2021-10-12 DIAGNOSIS — G8929 Other chronic pain: Secondary | ICD-10-CM | POA: Insufficient documentation

## 2021-10-12 DIAGNOSIS — M5136 Other intervertebral disc degeneration, lumbar region: Secondary | ICD-10-CM | POA: Insufficient documentation

## 2021-10-12 DIAGNOSIS — M545 Low back pain, unspecified: Secondary | ICD-10-CM | POA: Diagnosis present

## 2021-10-12 DIAGNOSIS — R29898 Other symptoms and signs involving the musculoskeletal system: Secondary | ICD-10-CM | POA: Insufficient documentation

## 2021-10-12 DIAGNOSIS — M25561 Pain in right knee: Secondary | ICD-10-CM | POA: Diagnosis present

## 2021-10-12 DIAGNOSIS — M4317 Spondylolisthesis, lumbosacral region: Secondary | ICD-10-CM | POA: Insufficient documentation

## 2021-10-12 DIAGNOSIS — M4316 Spondylolisthesis, lumbar region: Secondary | ICD-10-CM | POA: Diagnosis not present

## 2021-10-12 DIAGNOSIS — Z6841 Body Mass Index (BMI) 40.0 and over, adult: Secondary | ICD-10-CM | POA: Insufficient documentation

## 2021-10-12 NOTE — Patient Instructions (Signed)
Access Code: [email or web address] ?URL: [email or web address] ?Date: 10/12/2021 ?Prepared by: Greig Castilla Monicka Cyran ? ?Exercises ?Seated March - 3 x daily - 7 x weekly - 1-2 sets - 10 reps ?Seated Long Arc Quad - 3 x daily - 7 x weekly - 1-2 sets - 10 reps ?Seated Heel Toe Raises - 3 x daily - 7 x weekly - 1-2 sets - 10 reps ? ?

## 2021-10-12 NOTE — Therapy (Signed)
?OUTPATIENT PHYSICAL THERAPY THORACOLUMBAR EVALUATION ? ? ?Patient Name: Glenda Burton ?MRN: [id number] ?DOB:1966/03/21, 56 y.o., female ?Today's Date: 10/12/2021 ? ? PT End of Session - 10/12/21 1427   ? ? Visit Number 1   ? Number of Visits 9   ? Date for PT Re-Evaluation 12/14/21   ? Authorization Type [employer] Medicare (mcr guidelines, no auth)   ? PT Start Time 1440   ? PT Stop Time [id number]   ? PT Time Calculation (min) 38 min   ? Activity Tolerance Patient tolerated treatment well;Patient limited by fatigue;Patient limited by pain   ? Behavior During Therapy [facility] for tasks assessed/performed   ? ?  ?  ? ?  ? ? ?Past Medical History:  ?Diagnosis Date  ? Arthritis   ? Coronary artery disease   ? Coronary artery disease 02/2009  ? Moderate/severe mid LAD stenosis - medical management advised  ? Diabetes ([place])   ? Hyperlipidemia   ? Hypertension   ? Sarcoid   ? Sleep apnea   ? Sleep apnea   ? ?Past Surgical History:  ?Procedure Laterality Date  ? CESAREAN SECTION    ? HERNIA REPAIR    ? ?Patient Active Problem List  ? Diagnosis Date Noted  ? Morbid obesity ([place]) 05/03/2020  ? Leg edema 03/19/2020  ? Pain in both lower extremities 03/19/2020  ? Dyspnea on exertion 03/19/2020  ? Coronary artery disease of native artery of native heart with stable angina pectoris ([place]) 03/19/2020  ? Arthritis 10/07/2017  ? Diabetes ([place]) 10/07/2017  ? Hyperlipidemia LDL goal <70 10/07/2017  ? Sarcoid 10/07/2017  ? Sleep apnea 10/07/2017  ? Essential hypertension 10/07/2017  ? ? ?PCP: [facility] [employer] ? ?REFERRING PROVIDER: Jessy Oto, MD ? ?REFERRING DIAG: M54.50,G89.29 (ICD-10-CM) - Chronic bilateral low back pain, unspecified whether sciatica present M51.36 (ICD-10-CM) - Degenerative disc disease, lumbar M48.061 (ICD-10-CM) - Spinal stenosis of lumbar region without neurogenic claudication M43.16 (ICD-10-CM) - Spondylolisthesis, lumbar region M43.17 (ICD-10-CM) - Spondylolisthesis at L5-S1 level M17.12 (ICD-10-CM) -  Unilateral primary osteoarthritis, left knee M17.11 (ICD-10-CM) - Unilateral primary osteoarthritis, right knee Z68.45 (ICD-10-CM) - Body mass index 70 and over, adult [facility])  ? ?THERAPY DIAG:  ?Low back pain, unspecified back pain laterality, unspecified chronicity, unspecified whether sciatica present ? ?Pain in both knees, unspecified chronicity ? ?Muscle weakness (generalized) ? ?Other abnormalities of gait and mobility ? ?Other symptoms and signs involving the musculoskeletal system ? ?ONSET DATE: 8-9 years + ? ?SUBJECTIVE:                                                                                                                                                                                          ? ?  SUBJECTIVE STATEMENT: ?Patient history of back and bilateral knee pain for several years. Per MD note, Pain in the knees is present with standing in the morning and with starting to stand and walk after sitting. She also states trouble lifting her arms. ? ?PERTINENT HISTORY:  ?HTN, HLD, CAD, DM, Obesity, hx sarcoidosis  ? ?PAIN:  ?Are you having pain? Yes: NPRS scale: 6/10 ?Pain location: bilateral knees and back ?Pain description: aching and sharp, painful ?Aggravating factors: weightbearing/walking ?Relieving factors: rest and meds ? ? ?PRECAUTIONS: None ? ?WEIGHT BEARING RESTRICTIONS No ? ?FALLS:  ?Has patient fallen in last 6 months? No, Number of falls: 0 ? ?LIVING ENVIRONMENT: ?Lives with: lives with their spouse ?Lives in: Mobile home ?Stairs: Yes; External: 4-5 steps; uses ramp ?Has following equipment at home: Single point cane, Walker - 2 wheeled, shower chair, bed side commode, and Ramped entry ? ?OCCUPATION: Disability  ? ?PLOF: Independent with basic ADLs ? ?PATIENT GOALS move better ? ? ?OBJECTIVE:  ? ?Observation: Ambulates with wide BOS, limited knee and hip movement; peeling skin in poplital fossa bilateral L >R  ? ? ?PATIENT SURVEYS:  ?FOTO 42% function ? ?SCREENING FOR RED FLAGS: ?Bowel or  bladder incontinence: No ?Spinal tumors: No ?Cauda equina syndrome: No ?Compression fracture: No ?Abdominal aneurysm: No ? ?COGNITION: ? Overall cognitive status: Within functional limits for tasks assessed   ?  ?SENSATION: ?WFL ? ? ? ?POSTURE:  ?Slouched in seated, forward head ? ?PALPATION: ?L popliteal fossa inflammation TTP ? ?LUMBAR ROM:  ? ?Active  AROM  ?10/12/2021  ?Flexion 0% limited  ?Extension 50% limited  ?Right lateral flexion 50% limited  ?Left lateral flexion 50% limited  ?Right rotation   ?Left rotation   ? (Blank rows = not tested) ? ?LE ROM: decreased globally  ? ?Active  Right ?10/12/2021 Left ?10/12/2021  ?Hip flexion    ?Hip extension    ?Hip abduction    ?Hip adduction    ?Hip internal rotation    ?Hip external rotation    ?Knee flexion    ?Knee extension    ?Ankle dorsiflexion    ?Ankle plantarflexion    ?Ankle inversion    ?Ankle eversion    ? (Blank rows = not tested) ? ?LE MMT: ? ?MMT Right ?10/12/2021 Left ?10/12/2021  ?Hip flexion 3/5 3-/5  ?Hip extension    ?Hip abduction    ?Hip adduction    ?Hip internal rotation    ?Hip external rotation    ?Knee flexion 3+/5 3+/5  ?Knee extension 4-/5 4-/5  ?Ankle dorsiflexion 5/5 5/5  ?Ankle plantarflexion    ?Ankle inversion    ?Ankle eversion    ? (Blank rows = not tested) ? ? ? ?FUNCTIONAL TESTS:  ?5 times sit to stand: 51.28 seconds ? ?GAIT: ?Distance walked: 100 feet ?Assistive device utilized: None ?Level of assistance: Modified independence ?Comments: labored cadence with knees locked in extension ? ? ? ?TODAY'S TREATMENT  ?10/12/21 ?Seated marches 1x 10 bilateral  ?LAQ 1x 10  ?HR/TR seated x 10  ? ? ?PATIENT EDUCATION:  ?Education details: Patient educated on exam findings, POC, scope of PT, HEP, importance of compliance with HEP, and how to progress with therapy . ?Person educated: Patient ?Education method: Explanation, Demonstration, and Handouts ?Education comprehension: verbalized understanding and returned demonstration ? ? ?HOME EXERCISE  PROGRAM: ?Seated marches 1x 10 bilateral  ?LAQ 1x 10  ?HR/TR seated x 10  ? ?ASSESSMENT: ? ?CLINICAL IMPRESSION: ?Patient a 56 y.o. y.o. female who was seen today  for physical therapy evaluation and treatment for low back and bilateral knee pain.  ? ? ? ?OBJECTIVE IMPAIRMENTS Abnormal gait, decreased activity tolerance, decreased balance, decreased endurance, decreased mobility, difficulty walking, decreased ROM, decreased strength, increased edema, impaired perceived functional ability, impaired flexibility, improper body mechanics, and pain.  ? ?ACTIVITY LIMITATIONS cleaning, community activity, meal prep, laundry, yard work, shopping, and yard work.  ? ?PERSONAL FACTORS Fitness, Past/current experiences, Time since onset of injury/illness/exacerbation, and 3+ comorbidities: HTN, HLD, CAD, DM, Obesity, hx sarcoidosis   are also affecting patient's functional outcome.  ? ? ?REHAB POTENTIAL: Good ? ?CLINICAL DECISION MAKING: Evolving/moderate complexity ? ?EVALUATION COMPLEXITY: Moderate ? ?GOALS: ?Goals reviewed with patient? No ? ?SHORT TERM GOALS: Target date: 11/09/21 ? ?Patient will be independent with HEP in order to improve functional outcomes. ?Baseline:  ?Goal status: INITIAL ? ?2.  Patient will report at least 25% improvement in symptoms for improved quality of life. ?Baseline:  ?Goal status: INITIAL ? ?3.  Patient will be able to complete 5x STS in under 30 seconds in order to demonstrate improving functional strength for transfers. ?Baseline:  ?Goal status: INITIAL ? ? ?LONG TERM GOALS: Target date: 12/14/21 ? ?Patient will report at least 75% improvement in symptoms for improved quality of life. ?Baseline:  ?Goal status: INITIAL ? ?2.  Patient will improve FOTO score by at least 10 points in order to indicate improved tolerance to activity. ?Baseline:  ?Goal status: INITIAL ? ?3.  Patient will demonstrate at least 25% improvement in lumbar ROM in all restricted planes for improved ability to move trunk  while completing chores. ?Baseline:  ?Goal status: INITIAL ? ?4.  Patient will be able to ambulate at least 226 feet in 2MWT in order to demonstrate improved tolerance to activity. ?Baseline:  ?Goal status:

## 2021-10-30 ENCOUNTER — Ambulatory Visit: Payer: Medicare HMO | Admitting: Specialist

## 2021-12-04 NOTE — Progress Notes (Signed)
? ?Cardiology Office Note   ? ?Date:  12/05/2021  ? ?ID:  Glenda Burton, DOB 05-21-1966, MRN [id number] ? ?PCP:  [facility]  ?Cardiologist:  Nelva Bush, MD  ?Electrophysiologist:  None  ? ?Chief Complaint: Follow-up ? ?History of Present Illness:  ? ?Glenda Burton is a 56 y.o. female with history of CAD with borderline significant LAD stenosis by LHC in 2010, COPD, sarcoidosis, morbid obesity, and OSA who presents for follow-up of CAD. ? ?LHC in 03/07/2009 showed a tortuous LAD with 95% stenosis in the mid to distal vessel, dominant LCx without significant disease, small nondominant RCA without significant disease, and ramus intermedius without significant disease.  LVEF 60%.  Repeat LHC on 03/07/2009 with attempted FFR of the mid/distal LAD stenosis appeared to be 50% in severity.  FFR wire was unable to be advanced beyond the lesion due to significant vessel tortuosity with medical management being recommended.  Most recent echo from 03/2020 demonstrated an EF of 60 to [id number], normal diastolic function, normal RV systolic function and ventricular cavity size, no significant valvular abnormalities, and normal CVP.  Myocardial PET/[facility]A at [facility] was recommended to exclude significant ischemic heart disease.  ABIs in 03/2020 were normal bilaterally.  She was last seen in the office in 04/2020 and was feeling a bit better with decreased leg pain and swelling.  She continued to note knee discomfort with the left being greater than the right and was more pronounced when she stands or walks.  Exertional dyspnea was stable.  Subsequent PET/CTA on 05/26/2020 at [facility] was without evidence of ischemia.  LVEF greater than 60%.  No significant coronary artery calcifications were noted on attenuation corrected CT imaging. ? ?She comes in doing well from a cardiac perspective and is without symptoms of angina, palpitations, dyspnea, dizziness, presyncope, or syncope.  Her main complaint at this time continues to be  knee pain.  She is working with a healthy weight and wellness clinic to lose weight.  She is tolerating medications without issues.  She does not have any active cardiac issues or concerns at this time. ? ? ?Labs independently reviewed: ?No recent labs available for review.  We will contact her PCP to have these faxed over. ? ?Past Medical History:  ?Diagnosis Date  ? Arthritis   ? Coronary artery disease   ? Coronary artery disease 02/2009  ? Moderate/severe mid LAD stenosis - medical management advised  ? Diabetes ([place])   ? Hyperlipidemia   ? Hypertension   ? Sarcoid   ? Sleep apnea   ? Sleep apnea   ? ? ?Past Surgical History:  ?Procedure Laterality Date  ? CESAREAN SECTION    ? HERNIA REPAIR    ? ? ?Current Medications: ?Current Meds  ?Medication Sig  ? ACETAMINOPHEN EXTRA STRENGTH 500 MG tablet Take 1,000 mg by mouth every 8 (eight) hours as needed.  ? Albuterol Sulfate (PROVENTIL HFA IN) Inhale 2 puffs into the lungs every other day as needed.  ? aspirin EC 81 MG tablet Take 81 mg by mouth daily.  ? atorvastatin (LIPITOR) 40 MG tablet Take 40 mg by mouth daily.   ? celecoxib (CELEBREX) 100 MG capsule Take 100 mg by mouth 2 (two) times daily.  ? cholecalciferol (VITAMIN D) 1000 units tablet Take 1,000 Units by mouth daily.  ? fluticasone (FLONASE) 50 MCG/ACT nasal spray as needed.  ? gabapentin (NEURONTIN) 300 MG capsule Take 1 capsule (300 mg total) by mouth at bedtime.  ? Glucos-Chondroit-Hyaluron-MSM (  GLUCOSAMINE CHONDROITIN JOINT PO) Take by mouth daily.  ? hydrochlorothiazide (MICROZIDE) 12.5 MG capsule Take 12.5 mg by mouth daily.  ? isosorbide mononitrate (IMDUR) 60 MG 24 hr tablet Take 60 mg by mouth daily.  ? lisinopril (PRINIVIL,ZESTRIL) 20 MG tablet Take 20 mg by mouth daily.   ? metoprolol tartrate (LOPRESSOR) 100 MG tablet Take 100 mg by mouth 2 (two) times daily.   ? Multiple Vitamin (DAILY VITE PO) Take 1 tablet by mouth daily.  ? nitroGLYCERIN (NITROSTAT) 0.4 MG SL tablet   ? NON FORMULARY cpap  device  ? topiramate (TOPAMAX) 50 MG tablet Take 50 mg by mouth 2 (two) times daily.  ? VENTOLIN HFA 108 (90 Base) MCG/ACT inhaler as needed.  ? ? ?Allergies:   Patient has no known allergies.  ? ?Social History  ? ?Socioeconomic History  ? Marital status: Married  ?  Spouse name: Not on file  ? Number of children: Not on file  ? Years of education: Not on file  ? Highest education level: Not on file  ?Occupational History  ? Not on file  ?Tobacco Use  ? Smoking status: Never  ? Smokeless tobacco: Never  ?Vaping Use  ? Vaping Use: Never used  ?Substance and Sexual Activity  ? Alcohol use: No  ? Drug use: No  ? Sexual activity: Not on file  ?Other Topics Concern  ? Not on file  ?Social History Narrative  ? Not on file  ? ?Social Determinants of Health  ? ?Financial Resource Strain: Not on file  ?Food Insecurity: Not on file  ?Transportation Needs: Not on file  ?Physical Activity: Not on file  ?Stress: Not on file  ?Social Connections: Not on file  ?  ? ?Family History:  ?The patient's family history includes COPD in her father; Diabetes in her mother; Heart failure in her brother and father; Hypertension in her mother. ? ?ROS:   ?12-point review of systems is negative unless otherwise noted in the HPI. ? ? ?EKGs/Labs/Other Studies Reviewed:   ? ?Studies reviewed were summarized above. The additional studies were reviewed today: ? ?2D echo 04/11/2020: ?1. Left ventricular ejection fraction, by estimation, is 60 to 65%. The  ?left ventricle has normal function. The left ventricle has no regional  ?wall motion abnormalities. Left ventricular diastolic parameters were  ?normal.  ? 2. Right ventricular systolic function is normal. The right ventricular  ?size is normal. Tricuspid regurgitation signal is inadequate for assessing  ?PA pressure.  ? 3. The mitral valve is normal in structure. No evidence of mitral valve  ?regurgitation. No evidence of mitral stenosis.  ? 4. The aortic valve is normal in structure. Aortic valve  regurgitation is  ?not visualized. No aortic stenosis is present.  ? 5. The inferior vena cava is normal in size with greater than 50%  ?respiratory variability, suggesting right atrial pressure of 3 mmHg. ?__________ ? ?ABIs 04/11/2020: ?Summary:  ?Right: Resting right ankle-brachial index is within normal range. No  ?evidence of significant right lower extremity arterial disease. The right  ?toe-brachial index is normal.  ? ?Left: Resting left ankle-brachial index is within normal range. No  ?evidence of significant left lower extremity arterial disease. The left  ?toe-brachial index is normal.  ? ? ?EKG:  EKG is ordered today.  The EKG ordered today demonstrates NSR, 74 bpm, possible prior anterior infarct versus lead placement, no acute ST-T changes, no significant change when compared to prior tracing ? ?Recent Labs: ?No results found for requested  labs within last 8760 hours.  ?Recent Lipid Panel ?   ?Component Value Date/Time  ? CHOL  03/04/2009 0650  ?  184        ?ATP III CLASSIFICATION: ? <200     mg/dL   Desirable ? 200-239  mg/dL   Borderline High ? >=240    mg/dL   High ?        ? TRIG 72 03/04/2009 0650  ? HDL 52 03/04/2009 0650  ? CHOLHDL 3.5 03/04/2009 0650  ? VLDL 14 03/04/2009 0650  ? [place] (H) 03/04/2009 0650  ?  118        ?Total Cholesterol/HDL:CHD Risk ?Coronary Heart Disease Risk Table ?                    Men   Women ? 1/2 Average Risk   3.4   3.3 ? Average Risk       5.0   4.4 ? 2 X Average Risk   9.6   7.1 ? 3 X Average Risk  23.4   11.0 ?       ?Use the calculated Patient Ratio ?above and the CHD Risk Table ?to determine the patient's CHD Risk. ?       ?ATP III CLASSIFICATION (LDL): ? <100     mg/dL   Optimal ? 100-129  mg/dL   Near or Above ?                   Optimal ? 130-159  mg/dL   Borderline ? 160-189  mg/dL   High ? >190     mg/dL   Very High  ? ? ?PHYSICAL EXAM:   ? ?VS:  BP 110/72 (BP Location: Left Arm, Patient Position: Sitting, Cuff Size: Large)   Pulse 74   Ht 5\' 1"  (1.549  m)   Wt (!) 366 lb (166 kg)   SpO2 97%   BMI 69.16 kg/m?   BMI: Body mass index is 69.16 kg/m?. ? ?Physical Exam ?Constitutional:   ?   Appearance: She is well-developed.  ?HENT:  ?   Head: [place]

## 2021-12-05 ENCOUNTER — Ambulatory Visit: Payer: Medicare HMO | Admitting: Physician Assistant

## 2021-12-05 ENCOUNTER — Encounter: Payer: Self-pay | Admitting: Physician Assistant

## 2021-12-05 VITALS — BP 110/72 | HR 74 | Ht 61.0 in | Wt 366.0 lb

## 2021-12-05 DIAGNOSIS — I10 Essential (primary) hypertension: Secondary | ICD-10-CM | POA: Diagnosis not present

## 2021-12-05 DIAGNOSIS — I2510 Atherosclerotic heart disease of native coronary artery without angina pectoris: Secondary | ICD-10-CM | POA: Diagnosis not present

## 2021-12-05 DIAGNOSIS — R600 Localized edema: Secondary | ICD-10-CM | POA: Diagnosis not present

## 2021-12-05 DIAGNOSIS — E785 Hyperlipidemia, unspecified: Secondary | ICD-10-CM

## 2021-12-05 NOTE — Patient Instructions (Addendum)
Medication Instructions:  ?No changes at this time.  ? ?*If you need a refill on your cardiac medications before your next appointment, please call your pharmacy* ? ? ?Lab Work: ?None ? ?If you have labs (blood work) drawn today and your tests are completely normal, you will receive your results only by: ?MyChart Message (if you have MyChart) OR ?A paper copy in the mail ?If you have any lab test that is abnormal or we need to change your treatment, we will call you to review the results. ? ? ?Testing/Procedures: ?None ? ? ?Follow-Up: ?At [facility], you and your health needs are our priority.  As part of our continuing mission to provide you with exceptional heart care, we have created designated Provider Care Teams.  These Care Teams include your primary Cardiologist (physician) and Advanced Practice Providers (APPs -  Physician Assistants and Nurse Practitioners) who all work together to provide you with the care you need, when you need it. ? ?We recommend signing up for the patient portal called "MyChart".  Sign up information is provided on this After Visit Summary.  MyChart is used to connect with patients for Virtual Visits (Telemedicine).  Patients are able to view lab/test results, encounter notes, upcoming appointments, etc.  Non-urgent messages can be sent to your provider as well.   ?To learn more about what you can do with MyChart, go to [email or web address].   ? ?Your next appointment:   ?1 year(s) ? ?The format for your next appointment:   ?In Person ? ?Provider:   ?Yvonne Kendall, MD or Eula Listen, PA-C  ? ? ?Important Information About Sugar ? ? ? ? ?  ?

## 2022-01-31 ENCOUNTER — Ambulatory Visit: Payer: Medicare HMO | Admitting: Nurse Practitioner

## 2023-07-11 NOTE — Progress Notes (Unsigned)
Cardiology Office Note    Date:  07/11/2023   ID:  Berkli, Garnto 12/19/1965, MRN [id number]  PCP:  [facility]  Cardiologist:  Yvonne Kendall, MD  Electrophysiologist:  None   Chief Complaint: ***  History of Present Illness:   Glenda Burton is a 57 y.o. female with history of CAD with borderline significant LAD stenosis by LHC in 2010, COPD, sarcoidosis, morbid obesity, and OSA who presents for ***  LHC in 03/07/2009 showed a tortuous LAD with 95% stenosis in the mid to distal vessel, dominant LCx without significant disease, small nondominant RCA without significant disease, and ramus intermedius without significant disease.  LVEF 60%.  Repeat LHC on 03/07/2009 with attempted FFR of the mid/distal LAD stenosis appeared to be 50% in severity.  FFR wire was unable to be advanced beyond the lesion due to significant vessel tortuosity with medical management being recommended.  Echo from 03/2020 demonstrated an EF of 60 to 65%, normal diastolic function, normal RV systolic function and ventricular cavity size, no significant valvular abnormalities, and normal CVP.  Myocardial PET/CT at [facility] was recommended to exclude significant ischemic heart disease.  ABIs in 03/2020 were normal bilaterally.  Subsequent PET/CT on 05/26/2020 at [facility] was without evidence of ischemia.  LVEF greater than 60%.  No significant coronary artery calcifications were noted on attenuation corrected CT imaging.  She was last seen in our office on 12/05/2021, and was without symptoms of angina or cardiac decompensation.  She was subsequently evaluated by [facility] cardiology for bariatric surgery, most recently seeing them in 11/2022.  Lexiscan MPI on 10/01/2022 showed no evidence of reversible defect and normal LV systolic function.  Echo on 10/29/2022 showed an EF of 55 to 60%, normal diastolic function, normal RV cavity size and systolic function, no significant valvular abnormalities, normal size aortic root  and ascending aorta, and an estimated right atrial pressure of 3 mmHg.  ***   Labs independently reviewed: 04/2022 - Hgb 11.9, PLT 242, BUN 27, serum creatinine 1.09, potassium 4.2, albumin 4.4, AST/ALT normal, TC 139, TG 127, HDL 47, LDL 69, A1c 5.7 02/2020 - TSH normal  Past Medical History:  Diagnosis Date   Arthritis    Coronary artery disease    Coronary artery disease 02/2009   Moderate/severe mid LAD stenosis - medical management advised   Diabetes (HCC)    Hyperlipidemia    Hypertension    Sarcoid    Sleep apnea    Sleep apnea     Past Surgical History:  Procedure Laterality Date   CESAREAN SECTION     HERNIA REPAIR      Current Medications: No outpatient medications have been marked as taking for the 07/12/23 encounter (Appointment) with Sondra Barges, PA-C.    Allergies:   Patient has no known allergies.   Social History   Socioeconomic History   Marital status: Married    Spouse name: Not on file   Number of children: Not on file   Years of education: Not on file   Highest education level: Not on file  Occupational History   Not on file  Tobacco Use   Smoking status: Never   Smokeless tobacco: Never  Vaping Use   Vaping status: Never Used  Substance and Sexual Activity   Alcohol use: No   Drug use: No   Sexual activity: Not on file  Other Topics Concern   Not on file  Social History Narrative   Not on file  Social Drivers of Corporate investment banker Strain: Low Risk  (09/24/2022)   Received from [facility]   Overall Financial Resource Strain (CARDIA)    Difficulty of Paying Living Expenses: Not hard at all  Food Insecurity: No Food Insecurity (09/24/2022)   Received from [facility]   Hunger Vital Sign    Worried About Running Out of Food in the Last Year: Never true    Ran Out of Food in the Last Year: Never true  Transportation Needs: No Transportation Needs (09/24/2022)   Received from [facility] - Transportation     Lack of Transportation (Medical): No    Lack of Transportation (Non-Medical): No  Physical Activity: Not on file  Stress: Not on file  Social Connections: Unknown (11/30/2021)   Received from [facility]   Social Network    Social Network: Not on file     Family History:  The patient's family history includes COPD in her father; Diabetes in her mother; Heart failure in her brother and father; Hypertension in her mother.  ROS:   12-point review of systems is negative unless otherwise noted in the HPI.   EKGs/Labs/Other Studies Reviewed:    Studies reviewed were summarized above. The additional studies were reviewed today:  2D echo 04/11/2020: 1. Left ventricular ejection fraction, by estimation, is 60 to 65%. The  left ventricle has normal function. The left ventricle has no regional  wall motion abnormalities. Left ventricular diastolic parameters were  normal.   2. Right ventricular systolic function is normal. The right ventricular  size is normal. Tricuspid regurgitation signal is inadequate for assessing  PA pressure.   3. The mitral valve is normal in structure. No evidence of mitral valve  regurgitation. No evidence of mitral stenosis.   4. The aortic valve is normal in structure. Aortic valve regurgitation is  not visualized. No aortic stenosis is present.   5. The inferior vena cava is normal in size with greater than 50%  respiratory variability, suggesting right atrial pressure of 3 mmHg. __________   ABIs 04/11/2020: Summary:  Right: Resting right ankle-brachial index is within normal range. No  evidence of significant right lower extremity arterial disease. The right  toe-brachial index is normal.   Left: Resting left ankle-brachial index is within normal range. No  evidence of significant left lower extremity arterial disease. The left  toe-brachial index is normal.  __________  Myocardial PET/CT 05/26/2020 [facility]): - Probably normal  myocardial perfusion study  - No evidence of any significant ischemia or scar  - Left ventricular systolic function is normal. Post stress the ejection  fraction is > 60%.  - No significant coronary calcifications were noted on the attenuation CT  - Sensitivity and specificity of this test are reduced by the noted  increased BMI, motion, and attenuation.  __________  Nuclear stress test 10/01/2022 ([facility]): Impression:   No evidence of inducible ischemia.  Normal left ventricular function.  __________  2D echo 10/29/2022 ([facility]): Impression  Left Ventricle: Systolic function is normal. EF: 55-60%.   Right Ventricle: Systolic function is normal. Narrative  This result has an attachment that is not available.  Left Ventricle Left ventricle size is normal. Wall thickness is normal. Systolic function is normal. EF: 55-60%. Wall motion cannot be accurately assessed. Doppler parameters indicate normal diastolic function.  Right Ventricle Right ventricle size is normal. Systolic function is normal.  Left Atrium Left atrium size is normal.  Right Atrium Right atrium  size is normal.  IVC/SVC The inferior vena cava demonstrates a diameter of <=2.1 cm and collapses >50%; therefore, the right atrial pressure is estimated at 3 mmHg.  Mitral Valve Mitral valve structure is normal. There is no mitral regurgitation. There is no evidence of mitral valve stenosis.  Tricuspid Valve The tricuspid valve was not well visualized. There is trace regurgitation. There is no evidence of tricuspid valve stenosis. Unable to assess RVSP due to incomplete Doppler signal.  Aortic Valve The aortic valve was not well visualized. There is no regurgitation or stenosis.  Pulmonic Valve The pulmonic valve was not well visualized. Trace regurgitation. There is no evidence of pulmonic valve stenosis.  Ascending Aorta The aortic root is normal in size. The ascending aorta is normal in  size.  Pericardium There is no pericardial effusion.    EKG:  EKG is ordered today.  The EKG ordered today demonstrates ***  Recent Labs: No results found for requested labs within last 365 days.  Recent Lipid Panel    Component Value Date/Time   CHOL  03/04/2009 0650    184        ATP III CLASSIFICATION:  <200     mg/dL   Desirable  425-956  mg/dL   Borderline High  >=387    mg/dL   High          TRIG 72 03/04/2009 0650   HDL 52 03/04/2009 0650   CHOLHDL 3.5 03/04/2009 0650   VLDL 14 03/04/2009 0650   LDLCALC (H) 03/04/2009 0650    118        Total Cholesterol/HDL:CHD Risk Coronary Heart Disease Risk Table                     Men   Women  1/2 Average Risk   3.4   3.3  Average Risk       5.0   4.4  2 X Average Risk   9.6   7.1  3 X Average Risk  23.4   11.0        Use the calculated Patient Ratio above and the CHD Risk Table to determine the patient's CHD Risk.        ATP III CLASSIFICATION (LDL):  <100     mg/dL   Optimal  564-332  mg/dL   Near or Above                    Optimal  130-159  mg/dL   Borderline  951-884  mg/dL   High  >166     mg/dL   Very High    PHYSICAL EXAM:    VS:  There were no vitals taken for this visit.  BMI: There is no height or weight on file to calculate BMI.  Physical Exam  Wt Readings from Last 3 Encounters:  12/05/21 (!) 366 lb (166 kg)  09/18/21 (!) 359 lb (162.8 kg)  05/02/20 (!) 367 lb 4 oz (166.6 kg)     ASSESSMENT & PLAN:   CAD involving native coronary arteries without angina:  HTN: Blood pressure  Lower extremity edema:   {Are you ordering a CV Procedure (e.g. stress test, cath, DCCV, TEE, etc)?   Press F2        :[id number]}     Disposition: F/u with Dr. Okey Dupre or an APP in ***.   Medication Adjustments/Labs and Tests Ordered: Current medicines are reviewed at length with the patient today.  Concerns regarding medicines  are outlined above. Medication changes, Labs and Tests ordered today are summarized  above and listed in the Patient Instructions accessible in Encounters.   Signed, Eula Listen, PA-C 07/11/2023 12:54 PM     [facility] [place] [address] [place], [place] [address] [phone]

## 2023-07-12 ENCOUNTER — Ambulatory Visit: Payer: Medicare HMO | Admitting: Physician Assistant

## 2023-08-21 NOTE — Progress Notes (Unsigned)
Cardiology Office Note    Date:  08/22/2023   ID:  Glenda Burton, Glenda Burton 1966/04/18, MRN [id number]  PCP:  [facility]  Cardiologist:  Yvonne Kendall, MD  Electrophysiologist:  None   Chief Complaint: Follow-up  History of Present Illness:   Glenda Burton is a 58 y.o. female with history of CAD with borderline significant LAD stenosis by LHC in 2010, COPD, sarcoidosis, morbid obesity, and OSA who presents for follow-up of CAD.  LHC in 03/07/2009 showed a tortuous LAD with 95% stenosis in the mid to distal vessel, dominant LCx without significant disease, small nondominant RCA without significant disease, and ramus intermedius without significant disease.  LVEF 60%.  Repeat LHC on 03/07/2009 with attempted FFR of the mid/distal LAD stenosis appeared to be 50% in severity.  FFR wire was unable to be advanced beyond the lesion due to significant vessel tortuosity with medical management being recommended.  Echo in 03/2020 showed an EF of 60 to 65%, no regional wall motion abnormalities, normal LV diastolic function parameters, normal RV systolic function and ventricular cavity size, and an estimated right atrial pressure of 3 mmHg.  ABIs normal bilaterally in 03/2020.  Myocardial PET/CT in 04/2020 was a probable normal study with no evidence of significant ischemia or scar with an EF greater than 60%.  No significant coronary artery calcifications were noted on attenuation corrected CT imaging.  She was last seen in our office in 11/2021 and was doing well from a cardiac perspective, without symptoms of angina or cardiac decompensation.  She has since been evaluated by Dr. Gwyndolyn Saxon, with [facility] cardiology, for preoperative cardiac risk stratification for planned bariatric surgery.  Lexiscan MPI in 09/2022 showed no evidence of inducible ischemia with normal LV function.  Echo in 10/2022 showed an EF of 55 to 60%, normal LV diastolic function parameters, normal RV systolic function  and ventricular cavity size, and an estimated right atrial pressure of 3 mmHg.  She comes in doing well from a cardiac perspective and is without symptoms of angina or cardiac decompensation.  No dizziness, presyncope, or syncope.  No palpitations, progressive lower extremity swelling, or progressive orthopnea.  Unable to afford GLP-1 therapy.  Her weight is down 8 pounds when compared to our last visit in 11/2021.  She has not followed up with her pulmonologist in a while for pulmonary sarcoidosis.  Blood pressure is mildly elevated in the office today at 142/83, she has not yet taken her medications this morning.   Labs independently reviewed: 04/2022 - Hgb 11.9, PLT 242, BUN 27, serum creatinine 1.09, potassium 4.2, albumin 4.4, AST/ALT normal, TC 139, TG 127, HDL 47, LDL 69, A1c 5.7 02/2020 - TSH normal  Past Medical History:  Diagnosis Date   Arthritis    Coronary artery disease    Coronary artery disease 02/2009   Moderate/severe mid LAD stenosis - medical management advised   Diabetes (HCC)    Hyperlipidemia    Hypertension    Sarcoid    Sleep apnea    Sleep apnea     Past Surgical History:  Procedure Laterality Date   CESAREAN SECTION     HERNIA REPAIR      Current Medications: Current Meds  Medication Sig   ACETAMINOPHEN EXTRA STRENGTH 500 MG tablet Take 1,000 mg by mouth every 8 (eight) hours as needed.   Albuterol Sulfate (PROVENTIL HFA IN) Inhale 2 puffs into the lungs every other day as needed.   aspirin EC 81 MG tablet  Take 81 mg by mouth daily.   atorvastatin (LIPITOR) 40 MG tablet Take 40 mg by mouth daily.    celecoxib (CELEBREX) 100 MG capsule Take 100 mg by mouth 2 (two) times daily.   cholecalciferol (VITAMIN D) 1000 units tablet Take 1,000 Units by mouth daily.   fluticasone (FLONASE) 50 MCG/ACT nasal spray as needed.   gabapentin (NEURONTIN) 300 MG capsule Take 1 capsule (300 mg total) by mouth at bedtime.   Glucos-Chondroit-Hyaluron-MSM (GLUCOSAMINE  CHONDROITIN JOINT PO) Take by mouth daily.   hydrochlorothiazide (MICROZIDE) 12.5 MG capsule Take 12.5 mg by mouth daily.   isosorbide mononitrate (IMDUR) 60 MG 24 hr tablet Take 60 mg by mouth daily.   lisinopril (PRINIVIL,ZESTRIL) 20 MG tablet Take 20 mg by mouth daily.    metoprolol tartrate (LOPRESSOR) 100 MG tablet Take 100 mg by mouth 2 (two) times daily.    Multiple Vitamin (DAILY VITE PO) Take 1 tablet by mouth daily.   nitroGLYCERIN (NITROSTAT) 0.4 MG SL tablet    NON FORMULARY cpap device   topiramate (TOPAMAX) 50 MG tablet Take 50 mg by mouth 2 (two) times daily.   VENTOLIN HFA 108 (90 Base) MCG/ACT inhaler as needed.    Allergies:   Patient has no known allergies.   Social History   Socioeconomic History   Marital status: Married    Spouse name: Not on file   Number of children: Not on file   Years of education: Not on file   Highest education level: Not on file  Occupational History   Not on file  Tobacco Use   Smoking status: Never   Smokeless tobacco: Never  Vaping Use   Vaping status: Never Used  Substance and Sexual Activity   Alcohol use: No   Drug use: No   Sexual activity: Not on file  Other Topics Concern   Not on file  Social History Narrative   Not on file   Social Drivers of Health   Financial Resource Strain: Low Risk  (09/24/2022)   Received [facility]   Overall Financial Resource Strain (CARDIA)    Difficulty of Paying Living Expenses: Not hard at all  Food Insecurity: No Food Insecurity (09/24/2022)   Received from [facility]   Hunger Vital Sign    Worried About Running Out of Food in the Last Year: Never true    Ran Out of Food in the Last Year: Never true  Transportation Needs: No Transportation Needs (09/24/2022)   Received from [facility] - Transportation    Lack of Transportation (Medical): No    Lack of Transportation (Non-Medical): No  Physical Activity: Not on file  Stress: Not on file  Social  Connections: Unknown (11/30/2021)   Received from [facility]   Social Network    Social Network: Not on file     Family History:  The patient's family history includes COPD in her father; Diabetes in her mother; Heart failure in her brother and father; Hypertension in her mother.  ROS:   12-point review of systems is negative unless otherwise noted in the HPI.   EKGs/Labs/Other Studies Reviewed:    Studies reviewed were summarized above. The additional studies were reviewed today:  2D echo 03/2020: 1. Left ventricular ejection fraction, by estimation, is 60 to 65%. The  left ventricle has normal function. The left ventricle has no regional  wall motion abnormalities. Left ventricular diastolic parameters were  normal.   2. Right ventricular systolic function  is normal. The right ventricular  size is normal. Tricuspid regurgitation signal is inadequate for assessing  PA pressure.   3. The mitral valve is normal in structure. No evidence of mitral valve  regurgitation. No evidence of mitral stenosis.   4. The aortic valve is normal in structure. Aortic valve regurgitation is  not visualized. No aortic stenosis is present.   5. The inferior vena cava is normal in size with greater than 50%  respiratory variability, suggesting right atrial pressure of 3 mmHg.  __________  Myocardial PET/CT 05/26/2020 [facility]): - Probably normal myocardial perfusion study  - No evidence of any significant ischemia or scar  - Left ventricular systolic function is normal. Post stress the ejection  fraction is > 60%.  - No significant coronary calcifications were noted on the attenuation CT  - Sensitivity and specificity of this test are reduced by the noted  increased BMI, motion, and attenuation.  __________  Eugenie Birks MPI 10/01/2022 ([facility]): No evidence of inducible ischemia.  Normal left ventricular function.  __________  2D echo 10/29/2022 ([facility]): Left Ventricle: Systolic  function is normal. EF: 55-60%.    Right Ventricle: Systolic function is normal.    Left Ventricle  Left ventricle size is normal. Wall thickness is normal. Systolic function is normal. EF: 55-60%. Wall motion cannot be accurately assessed. Doppler parameters indicate normal diastolic function.   Right Ventricle  Right ventricle size is normal. Systolic function is normal.   Left Atrium  Left atrium size is normal.   Right Atrium  Right atrium size is normal.   IVC/SVC  The inferior vena cava demonstrates a diameter of <=2.1 cm and collapses >50%; therefore, the right atrial pressure is estimated at 3 mmHg.   Mitral Valve  Mitral valve structure is normal. There is no mitral regurgitation. There is no evidence of mitral valve stenosis.   Tricuspid Valve  The tricuspid valve was not well visualized. There is trace regurgitation. There is no evidence of tricuspid valve stenosis. Unable to assess RVSP due to incomplete Doppler signal.   Aortic Valve  The aortic valve was not well visualized. There is no regurgitation or stenosis.   Pulmonic Valve  The pulmonic valve was not well visualized. Trace regurgitation. There is no evidence of pulmonic valve stenosis.   Ascending Aorta  The aortic root is normal in size. The ascending aorta is normal in size.   Pericardium  There is no pericardial effusion.      EKG:  EKG is ordered today.  The EKG ordered today demonstrates sinus rhythm with nonspecific ST-T changes and underlying artifact  Recent Labs: No results found for requested labs within last 365 days.  Recent Lipid Panel    Component Value Date/Time   CHOL  03/04/2009 0650    184        ATP III CLASSIFICATION:  <200     mg/dL   Desirable  409-811  mg/dL   Borderline High  >=914    mg/dL   High          TRIG 72 03/04/2009 0650   HDL 52 03/04/2009 0650   CHOLHDL 3.5 03/04/2009 0650   VLDL 14 03/04/2009 0650   LDLCALC (H) 03/04/2009 0650    118        Total  Cholesterol/HDL:CHD Risk Coronary Heart Disease Risk Table                     Men   Women  1/2 Average Risk  3.4   3.3  Average Risk       5.0   4.4  2 X Average Risk   9.6   7.1  3 X Average Risk  23.4   11.0        Use the calculated Patient Ratio above and the CHD Risk Table to determine the patient's CHD Risk.        ATP III CLASSIFICATION (LDL):  <100     mg/dL   Optimal  657-846  mg/dL   Near or Above                    Optimal  130-159  mg/dL   Borderline  962-952  mg/dL   High  >841     mg/dL   Very High    PHYSICAL EXAM:    VS:  BP (!) 142/83 (BP Location: Left Arm, Patient Position: Sitting, Cuff Size: Normal)   Pulse 100   Ht 5\' 1"  (1.549 m)   Wt (!) 358 lb (162.4 kg)   SpO2 95%   BMI 67.64 kg/m   BMI: Body mass index is 67.64 kg/m.  Physical Exam Vitals reviewed.  Constitutional:      Appearance: She is well-developed.  HENT:     Head: Normocephalic and atraumatic.  Eyes:     General:        Right eye: No discharge.        Left eye: No discharge.  Neck:     Comments: Difficult to assess for JVD secondary to body habitus. Cardiovascular:     Rate and Rhythm: Normal rate and regular rhythm.     Heart sounds: Normal heart sounds, S1 normal and S2 normal. Heart sounds not distant. No midsystolic click and no opening snap. No murmur heard.    No friction rub.     Comments: Distant heart sounds. Pulmonary:     Effort: Pulmonary effort is normal. No respiratory distress.     Breath sounds: Normal breath sounds. No decreased breath sounds, wheezing or rales.  Chest:     Chest wall: No tenderness.  Abdominal:     General: There is no distension.  Musculoskeletal:     Cervical back: Normal range of motion.     Comments: Chronic woody lower extremity swelling with venous stasis changes.  Skin:    General: Skin is warm and dry.     Nails: There is no clubbing.  Neurological:     Mental Status: She is alert and oriented to person, place, and time.   Psychiatric:        Speech: Speech normal.        Behavior: Behavior normal.        Thought Content: Thought content normal.        Judgment: Judgment normal.     Wt Readings from Last 3 Encounters:  08/22/23 (!) 358 lb (162.4 kg)  12/05/21 (!) 366 lb (166 kg)  09/18/21 (!) 359 lb (162.8 kg)     ASSESSMENT & PLAN:   CAD involving the native coronary arteries without angina: She is without symptoms of angina or cardiac decompensation.  Continue aggressive risk factor modification and primary prevention including aspirin 81 mg, atorvastatin 40 mg, isosorbide mononitrate 60 mg, and Lopressor 100 mg twice daily.  No indication for further ischemic testing at this time.  HTN: Blood pressure is mildly elevated in the office today, though she has not yet taken her medications.  She remains on HCTZ 12.5  mg, Imdur 60 mg, lisinopril 20 mg, and Lopressor 100 mg twice daily.  Check renal function and electrolytes.  HLD: LDL 69 in 04/2022.  Remains on atorvastatin 40 mg.  Check CMP, lipid panel, and direct LDL.  Pulmonary sarcoidosis: Recommended she reestablish with pulmonology.  We will obtain cardiac MRI to evaluate for cardiac involvement.  Obesity: Weight is down 8 pounds today when compared to her last clinic visit in our office in 11/2021.  Unable to afford GLP-1 therapy.  Followed by bariatric medicine.    Disposition: F/u with Dr. Okey Dupre or an APP in 6 months.   Medication Adjustments/Labs and Tests Ordered: Current medicines are reviewed at length with the patient today.  Concerns regarding medicines are outlined above. Medication changes, Labs and Tests ordered today are summarized above and listed in the Patient Instructions accessible in Encounters.   Signed, Eula Listen, PA-C 08/22/2023 9:56 AM     [facility] [place] [address] [place], [place] [address] [phone]

## 2023-08-22 ENCOUNTER — Ambulatory Visit: Payer: Medicare HMO | Attending: Physician Assistant | Admitting: Physician Assistant

## 2023-08-22 ENCOUNTER — Encounter: Payer: Self-pay | Admitting: Physician Assistant

## 2023-08-22 VITALS — BP 142/83 | HR 100 | Ht 61.0 in | Wt 358.0 lb

## 2023-08-22 DIAGNOSIS — D860 Sarcoidosis of lung: Secondary | ICD-10-CM | POA: Diagnosis not present

## 2023-08-22 DIAGNOSIS — Z79899 Other long term (current) drug therapy: Secondary | ICD-10-CM

## 2023-08-22 DIAGNOSIS — I10 Essential (primary) hypertension: Secondary | ICD-10-CM

## 2023-08-22 DIAGNOSIS — D8685 Sarcoid myocarditis: Secondary | ICD-10-CM

## 2023-08-22 DIAGNOSIS — E785 Hyperlipidemia, unspecified: Secondary | ICD-10-CM

## 2023-08-22 DIAGNOSIS — I2510 Atherosclerotic heart disease of native coronary artery without angina pectoris: Secondary | ICD-10-CM | POA: Diagnosis not present

## 2023-08-22 NOTE — Patient Instructions (Signed)
Medication Instructions:  Your Physician recommend you continue on your current medication as directed.    *If you need a refill on your cardiac medications before your next appointment, please call your pharmacy*   Lab Work: Your provider would like for you to have following labs drawn today CBC, CMeT, Direct LDL, and Lipid panel.   If you have labs (blood work) drawn today and your tests are completely normal, you will receive your results only by: MyChart Message (if you have MyChart) OR A paper copy in the mail If you have any lab test that is abnormal or we need to change your treatment, we will call you to review the results.   Testing/Procedures: Your physician has requested that you have a cardiac MRI. Cardiac MRI uses a computer to create images of your heart as its beating, producing both still and moving pictures of your heart and major blood vessels. For further information please visit [email or web address]. Please follow the instruction sheet given to you today for more information.     You will be scheduled for Cardiac MRI at the location below.  ?  [facility] [address] [place], [place] [address] Please go to the [facility] and check-in with the desk attendant.   Magnetic resonance imaging (MRI) is a painless test that produces images of the inside of the body without using Xrays.  During an MRI, strong magnets and radio waves work together in a Data processing manager to form detailed images.   MRI images may provide more details about a medical condition than X-rays, CT scans, and ultrasounds can provide.  You may be given earphones to listen for instructions.  You may eat a light breakfast and take medications as ordered with the exception of furosemide, hydrochlorothiazide, chlorthalidone or spironolactone (or any other fluid pill). If you are undergoing a stress MRI, please avoid stimulants for 12 hr prior to test. (I.e. Caffeine,  nicotine, chocolate, or antihistamine medications)  An IV will be inserted into one of your veins. Contrast material will be injected into your IV. It will leave your body through your urine within a day. You may be told to drink plenty of fluids to help flush the contrast material out of your system.  You will be asked to remove all metal, including: Watch, jewelry, and other metal objects including hearing aids, hair pieces and dentures. Also wearable glucose monitoring systems (ie. Freestyle Libre and Omnipods) (Braces and fillings normally are not a problem.)   TEST WILL TAKE APPROXIMATELY 1 HOUR  PLEASE NOTIFY SCHEDULING AT LEAST 24 HOURS IN ADVANCE IF YOU ARE UNABLE TO KEEP YOUR APPOINTMENT. [phone]  For more information and frequently asked questions, please visit our website : [email or web address]  Please call the Cardiac Imaging Nurse Navigators with any questions/concerns. [phone] Office    Follow-Up: At [facility], you and your health needs are our priority.  As part of our continuing mission to provide you with exceptional heart care, we have created designated Provider Care Teams.  These Care Teams include your primary Cardiologist (physician) and Advanced Practice Providers (APPs -  Physician Assistants and Nurse Practitioners) who all work together to provide you with the care you need, when you need it.  We recommend signing up for the patient portal called "MyChart".  Sign up information is provided on this After Visit Summary.  MyChart is used to connect with patients for Virtual Visits (Telemedicine).  Patients are able to view lab/test results, encounter notes,  upcoming appointments, etc.  Non-urgent messages can be sent to your provider as well.   To learn more about what you can do with MyChart, go to [email or web address].    Your next appointment:   6 month(s)  Provider:   You may see Yvonne Kendall, MD or one of the following  Advanced Practice Providers on your designated Care Team:   Nicolasa Ducking, NP Eula Listen, PA-C Cadence Fransico Michael, PA-C Charlsie Quest, NP Carlos Levering, NP   Other Instructions PLEASE SCHEDULE A FOLLOW-UP WITH YOUR PULMONARY/LUNG DOCTOR

## 2023-08-23 LAB — COMPREHENSIVE METABOLIC PANEL
ALT: 15 [IU]/L (ref 0–32)
AST: 16 [IU]/L (ref 0–40)
Albumin: 4.1 g/dL (ref 3.8–4.9)
Alkaline Phosphatase: 121 [IU]/L (ref 44–121)
BUN/Creatinine Ratio: 13 (ref 9–23)
BUN: 13 mg/dL (ref 6–24)
Bilirubin Total: 0.3 mg/dL (ref 0.0–1.2)
CO2: 22 mmol/L (ref 20–29)
Calcium: 9.5 mg/dL (ref 8.7–10.2)
Chloride: 108 mmol/L — ABNORMAL HIGH (ref 96–106)
Creatinine, Ser: 0.97 mg/dL (ref 0.57–1.00)
Globulin, Total: 3.2 g/dL (ref 1.5–4.5)
Glucose: 114 mg/dL — ABNORMAL HIGH (ref 70–99)
Potassium: 3.8 mmol/L (ref 3.5–5.2)
Sodium: 145 mmol/L — ABNORMAL HIGH (ref 134–144)
Total Protein: 7.3 g/dL (ref 6.0–8.5)
eGFR: 67 mL/min/{1.73_m2} (ref >59)

## 2023-08-23 LAB — LIPID PANEL
Chol/HDL Ratio: 3.2 {ratio} (ref 0.0–4.4)
Cholesterol, Total: 117 mg/dL (ref 100–199)
HDL: 37 mg/dL — ABNORMAL LOW (ref >39)
LDL Chol Calc (NIH): 59 mg/dL (ref 0–99)
Triglycerides: 113 mg/dL (ref 0–149)
VLDL Cholesterol Cal: 21 mg/dL (ref 5–40)

## 2023-08-23 LAB — CBC
Hematocrit: 36.8 % (ref 34.0–46.6)
Hemoglobin: 11.4 g/dL (ref 11.1–15.9)
MCH: 28.8 pg (ref 26.6–33.0)
MCHC: 31 g/dL — ABNORMAL LOW (ref 31.5–35.7)
MCV: 93 fL (ref 79–97)
Platelets: 212 10*3/uL (ref 150–450)
RBC: 3.96 x10E6/uL (ref 3.77–5.28)
RDW: 12.9 % (ref 11.7–15.4)
WBC: 10.3 10*3/uL (ref 3.4–10.8)

## 2023-08-23 LAB — LDL CHOLESTEROL, DIRECT: LDL Direct: 57 mg/dL (ref 0–99)

## 2023-10-03 ENCOUNTER — Telehealth (HOSPITAL_COMMUNITY): Payer: Self-pay | Admitting: *Deleted

## 2023-10-03 NOTE — Telephone Encounter (Signed)
 Reaching out to patient to offer assistance regarding upcoming cardiac imaging study; pt verbalizes understanding of appt date/time, parking situation and where to check in, pre-test NPO status and medications ordered, and verified current allergies; name and call back number provided for further questions should they arise Johney Frame RN Navigator Cardiac Imaging [facility] [phone] office [phone] cell  Patient denies metal or claustrophobia.

## 2023-10-04 ENCOUNTER — Other Ambulatory Visit: Payer: Self-pay | Admitting: Physician Assistant

## 2023-10-04 ENCOUNTER — Ambulatory Visit (HOSPITAL_COMMUNITY)
Admission: RE | Admit: 2023-10-04 | Discharge: 2023-10-04 | Disposition: A | Discharge status: Home / Self Care | Payer: Medicare HMO | Source: Ambulatory Visit | Attending: Physician Assistant | Admitting: Physician Assistant

## 2023-10-04 DIAGNOSIS — D860 Sarcoidosis of lung: Secondary | ICD-10-CM | POA: Diagnosis present

## 2023-10-04 DIAGNOSIS — D8685 Sarcoid myocarditis: Secondary | ICD-10-CM | POA: Diagnosis present

## 2023-10-04 MED ORDER — GADOBUTROL 1 MMOL/ML IV SOLN
10.0000 mL | Freq: Once | INTRAVENOUS | Status: AC | PRN
  Administered 2023-10-04: 10 mL via INTRAVENOUS

## 2023-10-08 ENCOUNTER — Other Ambulatory Visit (HOSPITAL_COMMUNITY): Payer: Self-pay | Admitting: Emergency Medicine

## 2023-10-08 DIAGNOSIS — D869 Sarcoidosis, unspecified: Secondary | ICD-10-CM

## 2023-10-08 DIAGNOSIS — D860 Sarcoidosis of lung: Secondary | ICD-10-CM

## 2023-10-08 DIAGNOSIS — D8685 Sarcoid myocarditis: Secondary | ICD-10-CM

## 2023-10-30 ENCOUNTER — Encounter (HOSPITAL_COMMUNITY): Payer: Self-pay

## 2023-11-12 ENCOUNTER — Telehealth (HOSPITAL_COMMUNITY): Payer: Self-pay | Admitting: *Deleted

## 2023-11-12 NOTE — Telephone Encounter (Signed)
Reaching out to patient to offer assistance regarding upcoming cardiac imaging study; pt verbalizes understanding of appt date/time, parking situation and where to check in, pre-test NPO status and verified current allergies; name and call back number provided for further questions should they arise  Merle Prescott RN Navigator Cardiac Imaging [facility] [phone] office [phone] cell  Patient verbalized understanding of diet prep.   

## 2023-11-14 ENCOUNTER — Encounter (HOSPITAL_COMMUNITY)
Admission: RE | Admit: 2023-11-14 | Discharge: 2023-11-14 | Disposition: A | Discharge status: Home / Self Care | Source: Ambulatory Visit | Attending: Physician Assistant | Admitting: Physician Assistant

## 2023-11-14 DIAGNOSIS — D8685 Sarcoid myocarditis: Secondary | ICD-10-CM | POA: Insufficient documentation

## 2023-11-14 DIAGNOSIS — D869 Sarcoidosis, unspecified: Secondary | ICD-10-CM | POA: Insufficient documentation

## 2023-11-14 DIAGNOSIS — D860 Sarcoidosis of lung: Secondary | ICD-10-CM | POA: Insufficient documentation

## 2023-11-14 DIAGNOSIS — R931 Abnormal findings on diagnostic imaging of heart and coronary circulation: Secondary | ICD-10-CM | POA: Insufficient documentation

## 2023-11-14 LAB — NM PET CT MYOCARDIAL SARCOIDOSIS
LV dias vol: 105 mL (ref 46–106)
Nuc Stress EF: 70 %
Rest Nuclear Isotope Dose: 30 mCi

## 2023-11-14 MED ORDER — FLUDEOXYGLUCOSE F - 18 (FDG) INJECTION
8.9500 | Freq: Once | INTRAVENOUS | Status: AC
  Administered 2023-11-14: 8.95 via INTRAVENOUS

## 2023-11-14 MED ORDER — RUBIDIUM RB82 GENERATOR (RUBYFILL)
30.0100 | PACK | Freq: Once | INTRAVENOUS | Status: AC
  Administered 2023-11-14: 30.01 via INTRAVENOUS

## 2023-11-15 ENCOUNTER — Other Ambulatory Visit: Payer: Self-pay

## 2023-11-15 DIAGNOSIS — D8685 Sarcoid myocarditis: Secondary | ICD-10-CM

## 2023-11-22 ENCOUNTER — Telehealth: Payer: Self-pay | Admitting: Cardiology

## 2023-11-22 NOTE — Telephone Encounter (Signed)
 Called to confirm/remind patient of their appointment at the [facility] on 11/25/23.   Appointment:   [x] Confirmed  [] Left mess   [] No answer/No voice mail  [] VM Full/unable to leave message  [] Phone not in service  Patient reminded to bring all medications and/or complete list.  Confirmed patient has transportation. Gave directions, instructed to utilize valet parking.

## 2023-11-25 ENCOUNTER — Ambulatory Visit: Attending: Cardiology | Admitting: Cardiology

## 2023-11-25 ENCOUNTER — Other Ambulatory Visit (HOSPITAL_COMMUNITY): Payer: Self-pay

## 2023-11-25 ENCOUNTER — Telehealth: Payer: Self-pay

## 2023-11-25 ENCOUNTER — Encounter: Payer: Self-pay | Admitting: Cardiology

## 2023-11-25 VITALS — BP 132/77 | HR 83 | Wt 362.4 lb

## 2023-11-25 DIAGNOSIS — Z7982 Long term (current) use of aspirin: Secondary | ICD-10-CM | POA: Insufficient documentation

## 2023-11-25 DIAGNOSIS — Z79899 Other long term (current) drug therapy: Secondary | ICD-10-CM | POA: Insufficient documentation

## 2023-11-25 DIAGNOSIS — I10 Essential (primary) hypertension: Secondary | ICD-10-CM | POA: Diagnosis not present

## 2023-11-25 DIAGNOSIS — J45909 Unspecified asthma, uncomplicated: Secondary | ICD-10-CM | POA: Insufficient documentation

## 2023-11-25 DIAGNOSIS — I25118 Atherosclerotic heart disease of native coronary artery with other forms of angina pectoris: Secondary | ICD-10-CM | POA: Diagnosis not present

## 2023-11-25 DIAGNOSIS — D860 Sarcoidosis of lung: Secondary | ICD-10-CM | POA: Diagnosis not present

## 2023-11-25 DIAGNOSIS — G4733 Obstructive sleep apnea (adult) (pediatric): Secondary | ICD-10-CM | POA: Diagnosis not present

## 2023-11-25 DIAGNOSIS — Z6841 Body Mass Index (BMI) 40.0 and over, adult: Secondary | ICD-10-CM | POA: Insufficient documentation

## 2023-11-25 DIAGNOSIS — D8685 Sarcoid myocarditis: Secondary | ICD-10-CM | POA: Diagnosis present

## 2023-11-25 NOTE — Patient Instructions (Addendum)
 Vaccinations Recommended:  Tdap, Prevnar 20, and Shingrix   Medication Changes:  No medication changes today!  Lab Work:  Go DOWN to LOWER LEVEL (LL) to have your blood work completed inside of [employer] office.  We will only call you if the results are abnormal or if the provider would like to make medication changes.  Referrals:  You have been referred to [facility] with Dr. Baldwin Levee. This office will contact you in order to schedule an appointment.   Follow-Up in: Please follow up with the [facility] in 3 weeks with the pharmacist and again in 6 weeks with Dr. Mitzie Anda.   At the [facility], you and your health needs are our priority. We have a designated team specialized in the treatment of Heart Failure. This Care Team includes your primary Heart Failure Specialized Cardiologist (physician), Advanced Practice Providers (APPs- Physician Assistants and Nurse Practitioners), and Pharmacist who all work together to provide you with the care you need, when you need it.   You may see any of the following providers on your designated Care Team at your next follow up:  Dr. Jules Oar Dr. Peder Bourdon Dr. Alwin Baars Dr. Judyth Nunnery Shawnee Dellen, FNP Bevely Brush, RPH-CPP  Please be sure to bring in all your medications bottles to every appointment.   Need to Contact Us :  If you have any questions or concerns before your next appointment please send us  a message through [facility] or call our office at [phone].    TO LEAVE A MESSAGE FOR THE NURSE SELECT OPTION 2, PLEASE LEAVE A MESSAGE INCLUDING: YOUR NAME DATE OF BIRTH CALL BACK NUMBER REASON FOR CALL**this is important as we prioritize the call backs  YOU WILL RECEIVE A CALL BACK THE SAME DAY AS LONG AS YOU CALL BEFORE 4:00 PM

## 2023-11-25 NOTE — Progress Notes (Signed)
 PCP: [facility] HF Cardiology: Dr. Mitzie Anda  58 y.o. 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  Patient had a cath in 8/10 showing tortuous LAD, 95% mid-distal LAD stenosis.  Repeat cath for [facility] showed only 50% proximal-mid LAD stenosis, unable to complete FFR as unable to wire vessel due to tortuosity.  Cardiolite in 3/24 showed no ischemia or infarction.  Patient had a mediastinoscopy done in 2008 with mediastinal lymph node biopsy showing evidence for sarcoidosis. She says she was treated remotely with prednisone but has had no recent immunosuppressant therapy.  She has been undergoing evaluation for bariatric surgery but ultimately has decided that she does not want it.   Patient was seen recently by Varney Gentleman.  Given history of sarcoidosis, he ordered a cardiac MRI to look for cardiac involvement.   This showed EF 66%, mid-wall LGE in the basal anteroseptal wall (though technically difficult study).  The LGE could be a lesion from cardiac sarcoidosis.  As a followup from this, cardiac PET was ordered.  This showed FDG uptake in the lateral wall base=>apex, basal inferior wall, and anterolateral papillary muscle, perfusion normal.  Also mediastinal FDG uptake noted in hilar nodes. This study is suggestive of active inflammation most likely from cardiac sarcoidosis, hilar nodes also involved.  Patient presents here today for management of cardiac sarcoidosis.  She has been stable symptomatically.  She is short of breath walking long distances but has no problems walking around the house. She uses a wheelchair for longer distances.  No palpitations or syncope.  No chest pain.  No lightheadedness.  She uses CPAP at night.   ECG (personally reviewed): NSR, normal  Labs (1/25): LDL 57, K 3.8, creatinine 1.61  PMH: 1. Sarcoidosis: Biopsy-proven in 2008 by mediastinal lymph node sampling.  Possible cardiac  involvement.  - Cardiac MRI (3/25) with EF 66%, mid-wall LGE in the basal anteroseptal wall (though technically difficult study).  This could be a lesion from cardiac sarcoidosis.  - Cardiac PET (4/25): FDG uptake in the lateral wall base=>apex, basal inferior wall, and anterolateral papillary muscle, perfusion normal.  Also mediastinal FDG uptake noted in hilar nodes. This study is suggestive of active inflammation possibly from cardiac sarcoidosis.  EF 70%.  2. OSA: Uses CPAP.  3. Obesity 4. Asthma 5. ABIs normal in 9/21. 6. HTN 7. Hyperlipidemia 8. CAD: LHC (8/10) with tortuous LAD, 95% mid-distal LAD stenosis.  Repeat cath for [facility] showed only 50% proximal-mid LAD stenosis, unable to complete FFR as unable to wire vessel due to tortuosity.  - Cardiolite (3/24): No ischemia/infarction.  - Echo (4/24): EF 55-60%, normal RV.   FH: Aunt with sarcoidosis.   ROS: All systems reviewed and negative except as per HPI.   Social History   Socioeconomic History   Marital status: Married    Spouse name: Not on file   Number of children: Not on file   Years of education: Not on file   Highest education level: Not on file  Occupational History   Not on file  Tobacco Use   Smoking status: Never   Smokeless tobacco: Never  Vaping Use   Vaping status: Never Used  Substance and Sexual Activity   Alcohol use: No   Drug use: No   Sexual activity: Not on file  Other Topics Concern   Not on file  Social History Narrative   Not on file   Social Drivers of  Health   Financial Resource Strain: Low Risk  (09/24/2022)   Received from [facility]   Overall Financial Resource Strain (CARDIA)    Difficulty of Paying Living Expenses: Not hard at all  Food Insecurity: No Food Insecurity (09/24/2022)   [facility] from [facility]   Hunger Vital Sign    Worried About Running Out of Food in the Last Year: Never true    Ran Out of Food in the Last Year: Never true  Transportation Needs: No  Transportation Needs (09/24/2022)   Received from [facility] - Transportation    Lack of Transportation (Medical): No    Lack of Transportation (Non-Medical): No  Physical Activity: Not on file  Stress: Not on file  Social Connections: Unknown (11/30/2021)   Received from [facility]   Social Network    Social Network: Not on file  Intimate Partner Violence: Unknown (11/03/2021)   Received from [facility]   HITS    Physically Hurt: Not on file    Insult or Talk Down To: Not on file    Threaten Physical Harm: Not on file    Scream or Curse: Not on file    Current Outpatient Medications  Medication Sig Dispense Refill   ACETAMINOPHEN EXTRA STRENGTH 500 MG tablet Take 1,000 mg by mouth every 8 (eight) hours as needed.     Albuterol Sulfate (PROVENTIL HFA IN) Inhale 2 puffs into the lungs every other day as needed.     aspirin EC 81 MG tablet Take 81 mg by mouth daily.     atorvastatin (LIPITOR) 40 MG tablet Take 40 mg by mouth daily.      celecoxib (CELEBREX) 100 MG capsule Take 100 mg by mouth 2 (two) times daily.     cholecalciferol (VITAMIN D) 1000 units tablet Take 1,000 Units by mouth daily.     fluticasone (FLONASE) 50 MCG/ACT nasal spray as needed.     Glucos-Chondroit-Hyaluron-MSM (GLUCOSAMINE CHONDROITIN JOINT PO) Take by mouth daily.     hydrochlorothiazide (MICROZIDE) 12.5 MG capsule Take 12.5 mg by mouth daily.     isosorbide mononitrate (IMDUR) 60 MG 24 hr tablet Take 60 mg by mouth daily.     lisinopril (PRINIVIL,ZESTRIL) 20 MG tablet Take 20 mg by mouth daily.      metoprolol tartrate (LOPRESSOR) 100 MG tablet Take 100 mg by mouth 2 (two) times daily.      Multiple Vitamin (DAILY VITE PO) Take 1 tablet by mouth daily.     nitroGLYCERIN (NITROSTAT) 0.4 MG SL tablet      NON FORMULARY cpap device     topiramate (TOPAMAX) 50 MG tablet Take 50 mg by mouth 2 (two) times daily.     VENTOLIN HFA 108 (90 Base) MCG/ACT inhaler as  needed.     No current facility-administered medications for this visit.   BP 132/77   Pulse 83   Wt (!) 362 lb 6.4 oz (164.4 kg)   SpO2 99%   BMI 68.47 kg/m  General: NAD, obese.  Neck: No JVD, no thyromegaly or thyroid nodule.  Lungs: Clear to auscultation bilaterally with normal respiratory effort. CV: Nondisplaced PMI.  Heart regular S1/S2, no S3/S4, no murmur.  No peripheral edema.  No carotid bruit.  Normal pedal pulses.  Abdomen: Soft, nontender, no hepatosplenomegaly, no distention.  Skin: Intact without lesions or rashes.  Neurologic: Alert and oriented x 3.  Psych: Normal affect. Extremities: No clubbing or cyanosis.  HEENT: Normal.  1. 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  She is not on immunosuppression.   Cardiac MRI in 3/25 showed EF 66%, mid-wall LGE in the basal anteroseptal wall (though technically difficult study).  The LGE could be a lesion from cardiac sarcoidosis.  As a followup from this, cardiac PET was ordered.  This showed FDG uptake in the lateral wall base=>apex, basal inferior wall, and anterolateral papillary muscle, perfusion normal.  Also mediastinal FDG uptake noted in hilar nodes. This study is suggestive of active inflammation most likely from cardiac sarcoidosis, hilar nodes also involved.  Last CT chest in 2019 was unremarkable. Based on the evaluation so far, I suspect that the patient has active cardiac involvement by sarcoidosis.  No evidence for arrhythmias.  - I would like to initiate treatment with prednisone + methotrexate using our usual protocol (tapering up on methotrexate and down on prednisone).  She will need vaccinations, Bactrim prophylaxis until prednisone dose titrated down to the appropriate level, PPI.  Repeat cardiac PET in 6 months or so to assess for response. Follow CBC and LFTs.  - She will continue metoprolol.  - Refer to [facility] pulmonary for full evaluation for  active pulmonary sarcoidosis.  2. OSA: Continue CPAP.  3. Morbid obesity: She has decided against bariatric surgery for now.  - I will try to see if we can get her a GLP-1 agonist. Check hgbA1c today.  - We discussed the need to cut back considerably on caloric intake after starting prednisone.  4.  CAD: LHC (8/10) with tortuous LAD, 95% mid-distal LAD stenosis.  Repeat cath for [facility] showed only 50% proximal-mid LAD stenosis, unable to complete FFR as unable to wire vessel due to tortuosity. Cardiolite in 3/24 with no ischemia/infarction. No chest pain.  - Continue ASA 81 daily.  - Continue atorvastatin 40 daily, good lipids in 1/25.  5. HTN: BP controlled.  - Continue lisinopril. BMET today.  - Continue hydrochlorothiazide.   Followup in 3 wks with HF pharmacist, see me in 6 wks.   I spent 57 minutes reviewing records, interviewing/examining patient, and managing orders.   Peder Bourdon 11/25/2023

## 2023-11-25 NOTE — Telephone Encounter (Signed)
 Advanced Heart Failure Patient Advocate Encounter  Test billing for GLP1s returned the following results:  $12.15 for 28 days: Mounjaro, Ozempic, Trulicity Not covered: Saxenda, Wegovy, Zepbound   Stephaine H, CPhT Rx Patient Advocate Phone: [phone]

## 2023-11-28 LAB — COMPREHENSIVE METABOLIC PANEL WITH GFR
ALT: 12 IU/L (ref 0–32)
AST: 14 IU/L (ref 0–40)
Albumin: 4.2 g/dL (ref 3.8–4.9)
Alkaline Phosphatase: 119 IU/L (ref 44–121)
BUN/Creatinine Ratio: 20 (ref 9–23)
BUN: 19 mg/dL (ref 6–24)
Bilirubin Total: <0.2 mg/dL (ref 0.0–1.2)
CO2: 19 mmol/L — ABNORMAL LOW (ref 20–29)
Calcium: 9.9 mg/dL (ref 8.7–10.2)
Chloride: 109 mmol/L — ABNORMAL HIGH (ref 96–106)
Creatinine, Ser: 0.97 mg/dL (ref 0.57–1.00)
Globulin, Total: 3.2 g/dL (ref 1.5–4.5)
Glucose: 110 mg/dL — ABNORMAL HIGH (ref 70–99)
Potassium: 4.5 mmol/L (ref 3.5–5.2)
Sodium: 144 mmol/L (ref 134–144)
Total Protein: 7.4 g/dL (ref 6.0–8.5)
eGFR: 67 mL/min/{1.73_m2} (ref >59)

## 2023-11-28 LAB — QUANTIFERON-TB GOLD PLUS
QuantiFERON Mitogen Value: 1.7 [IU]/mL
QuantiFERON Nil Value: 0.05 [IU]/mL
QuantiFERON TB1 Ag Value: 0.04 [IU]/mL
QuantiFERON TB2 Ag Value: 0.04 [IU]/mL
QuantiFERON-TB Gold Plus: NEGATIVE

## 2023-11-28 LAB — BRAIN NATRIURETIC PEPTIDE: BNP: 19.7 pg/mL (ref 0.0–100.0)

## 2023-11-28 LAB — CBC WITH DIFFERENTIAL/PLATELET
Basophils Absolute: 0.1 10*3/uL (ref 0.0–0.2)
Basos: 0 %
EOS (ABSOLUTE): 0.3 10*3/uL (ref 0.0–0.4)
Eos: 2 %
Hematocrit: 32.8 % — ABNORMAL LOW (ref 34.0–46.6)
Hemoglobin: 9.8 g/dL — ABNORMAL LOW (ref 11.1–15.9)
Immature Grans (Abs): 0.1 10*3/uL (ref 0.0–0.1)
Immature Granulocytes: 1 %
Lymphocytes Absolute: 1.8 10*3/uL (ref 0.7–3.1)
Lymphs: 15 %
MCH: 27.2 pg (ref 26.6–33.0)
MCHC: 29.9 g/dL — ABNORMAL LOW (ref 31.5–35.7)
MCV: 91 fL (ref 79–97)
Monocytes Absolute: 0.8 10*3/uL (ref 0.1–0.9)
Monocytes: 7 %
Neutrophils Absolute: 8.7 10*3/uL — ABNORMAL HIGH (ref 1.4–7.0)
Neutrophils: 75 %
Platelets: 271 10*3/uL (ref 150–450)
RBC: 3.6 x10E6/uL — ABNORMAL LOW (ref 3.77–5.28)
RDW: 14.8 % (ref 11.7–15.4)
WBC: 11.6 10*3/uL — ABNORMAL HIGH (ref 3.4–10.8)

## 2023-11-28 LAB — HEMOGLOBIN A1C
Est. average glucose Bld gHb Est-mCnc: 108 mg/dL
Hgb A1c MFr Bld: 5.4 % (ref 4.8–5.6)

## 2023-11-28 LAB — HEPATITIS B CORE ANTIBODY, IGM: Hep B C IgM: NEGATIVE

## 2023-11-28 LAB — HEPATITIS C ANTIBODY: Hep C Virus Ab: NONREACTIVE

## 2023-11-28 NOTE — Progress Notes (Signed)
 Please see my last note which indicates a history of pulmonary sarcoidosis. This led to the cardiac MRI, which was suggestive of cardiac sarcoidosis with recommendation to pursue PET scan for sarcoid (see cMRI impression). Please note, this study is a different PET scan than stress testing and does not evaluate for ischemia.

## 2023-11-29 ENCOUNTER — Encounter (HOSPITAL_BASED_OUTPATIENT_CLINIC_OR_DEPARTMENT_OTHER): Payer: Self-pay

## 2023-12-02 ENCOUNTER — Telehealth: Payer: Self-pay | Admitting: Pharmacist

## 2023-12-02 NOTE — Telephone Encounter (Signed)
 Called to confirm/remind patient of their appointment at the [facility] on 12/03/23.   Appointment:   [x] Confirmed  [] Left mess   [] No answer/No voice mail  [] VM Full/unable to leave message  [] Phone not in service  Patient reminded to bring all medications and/or complete list.  Confirmed patient has transportation. Gave directions, instructed to utilize valet parking.

## 2023-12-03 ENCOUNTER — Telehealth: Payer: Self-pay

## 2023-12-03 ENCOUNTER — Other Ambulatory Visit: Payer: Self-pay

## 2023-12-03 ENCOUNTER — Ambulatory Visit: Attending: Family | Admitting: Pharmacist

## 2023-12-03 ENCOUNTER — Other Ambulatory Visit (HOSPITAL_COMMUNITY): Payer: Self-pay

## 2023-12-03 DIAGNOSIS — I2510 Atherosclerotic heart disease of native coronary artery without angina pectoris: Secondary | ICD-10-CM | POA: Insufficient documentation

## 2023-12-03 DIAGNOSIS — I10 Essential (primary) hypertension: Secondary | ICD-10-CM | POA: Insufficient documentation

## 2023-12-03 DIAGNOSIS — E119 Type 2 diabetes mellitus without complications: Secondary | ICD-10-CM | POA: Diagnosis not present

## 2023-12-03 DIAGNOSIS — R0609 Other forms of dyspnea: Secondary | ICD-10-CM | POA: Diagnosis not present

## 2023-12-03 DIAGNOSIS — G4733 Obstructive sleep apnea (adult) (pediatric): Secondary | ICD-10-CM | POA: Insufficient documentation

## 2023-12-03 DIAGNOSIS — Z7985 Long-term (current) use of injectable non-insulin antidiabetic drugs: Secondary | ICD-10-CM | POA: Diagnosis not present

## 2023-12-03 DIAGNOSIS — I5022 Chronic systolic (congestive) heart failure: Secondary | ICD-10-CM | POA: Diagnosis not present

## 2023-12-03 DIAGNOSIS — D8689 Sarcoidosis of other sites: Secondary | ICD-10-CM | POA: Diagnosis present

## 2023-12-03 DIAGNOSIS — J45909 Unspecified asthma, uncomplicated: Secondary | ICD-10-CM | POA: Insufficient documentation

## 2023-12-03 DIAGNOSIS — D860 Sarcoidosis of lung: Secondary | ICD-10-CM | POA: Diagnosis not present

## 2023-12-03 MED ORDER — MOUNJARO 2.5 MG/0.5ML ~~LOC~~ SOAJ
2.5000 mg | SUBCUTANEOUS | 0 refills | Status: DC
  Filled 2023-12-03: qty 2, 28d supply, fill #0

## 2023-12-03 MED ORDER — CALCIUM 200 MG PO TABS: 1200.0000 mg | ORAL_TABLET | Freq: Every day | ORAL | Status: DC

## 2023-12-03 MED ORDER — METHOTREXATE SODIUM 2.5 MG PO TABS
ORAL_TABLET | ORAL | 1 refills | Status: DC
  Filled 2023-12-03: qty 18, 28d supply, fill #0
  Filled 2023-12-26: qty 18, 28d supply, fill #1

## 2023-12-03 MED ORDER — FOLIC ACID 1 MG PO TABS
1.0000 mg | ORAL_TABLET | Freq: Every day | ORAL | 3 refills | Status: DC
Start: 2023-12-03 — End: 2024-02-13
  Filled 2023-12-03: qty 90, 90d supply, fill #0

## 2023-12-03 MED ORDER — SULFAMETHOXAZOLE-TRIMETHOPRIM 800-160 MG PO TABS
1.0000 | ORAL_TABLET | ORAL | 3 refills | Status: DC
  Filled 2023-12-03: qty 12, 28d supply, fill #0
  Filled 2023-12-26: qty 12, 28d supply, fill #1
  Filled 2024-01-21: qty 12, 28d supply, fill #2

## 2023-12-03 MED ORDER — PREDNISONE 10 MG PO TABS
ORAL_TABLET | ORAL | 2 refills | Status: DC
  Filled 2023-12-03: qty 90, 30d supply, fill #0
  Filled 2023-12-26: qty 90, 30d supply, fill #1

## 2023-12-03 MED ORDER — OMEPRAZOLE 20 MG PO CPDR
20.0000 mg | DELAYED_RELEASE_CAPSULE | Freq: Every day | ORAL | 11 refills | Status: DC
  Filled 2023-12-03: qty 30, 30d supply, fill #0
  Filled 2023-12-30: qty 30, 30d supply, fill #1
  Filled 2024-01-25: qty 30, 30d supply, fill #2

## 2023-12-03 NOTE — Patient Instructions (Addendum)
 It was a pleasure seeing you today!  MEDICATIONS: -Start taking methotrexate and prednisone as outlined in your schedule -Start taking calcium and vitamin D over the counter to keep your bones strong while on prednisone -Start taking omeprazole 20 mg daily while on prednisone to prevent stomach ulcers -Start taking folic acid 1 mg daily while on methotrexate -Start taking Bactrim every Monday, Wednesday, Friday to prevent infection while your immune system is weak. -Start Mounjaro 2.5 mg weekly -Call if you have questions about your medications.  LABS: -We will call you if your labs need attention.  NEXT APPOINTMENT: Return to clinic in 2 weeks to see Linnea Todisco.  In general, to take care of your heart failure: -Limit your fluid intake to 2 Liters (half-gallon) per day.   -Limit your salt intake to ideally 2-3 grams (2000-3000 mg) per day. -Weigh yourself daily and record, and bring that "weight diary" to your next appointment.  (Weight gain of 2-3 pounds in 1 day typically means fluid weight.) -The medications for your heart are to help your heart and help you live longer.   -Please contact us  before stopping any of your heart medications.  Call the clinic at [phone] with questions or to reschedule future appointments.    Personalized Cardiac Sarcoid Treatment Protocol through 03/24/24  Prednisone  Weeks 1-4 5/7 through 6/3 30 mg (three 10 mg tablets) daily  Weeks 5-8 6/4 through 7/1 25 mg (two and a half 10 mg tablets) daily  Weeks 9-12 7/2 through 7/29 20 mg (two 10 mg tablets) daily  Weeks 13-16 7/30 through 8/26 15 mg (one and a half 10 mg tablets) daily   Methotrexate  Weeks 1-2 5/7 & 5/14 10 mg (four 2.5 mg tablets) once weekly  Weeks 3-4 5/21 & 5/28 12.5 mg (five 2.5 mg tablets) once weekly  Weeks 5-6 6/4 & 6/11 15 mg (six 2.5 mg tablets) once weekly  Weeks 7-8 6/18 & 6/25 17.5 mg (seven 2.5 mg tablets) once weekly  Weeks 9+ 7/2 & on 20 mg (eight 2.5 mg tablets) once  weekly   Until instructed otherwise, continue taking vitamin D [phone] IU daily, Calcium 1200-1500 mg daily, folic acid, and Omeprazole 20 mg daily, Bactrim every Monday, Wednesday, and Friday

## 2023-12-03 NOTE — Progress Notes (Deleted)
 Advanced Heart Failure Clinic Note  PCP: [facility] PCP-Cardiologist: Sammy Crisp, MD HF-Cardiologist: Peder Bourdon, MD  HPI: Glenda Burton is a 58 YO female with a history of sarcoidosis, asthma, OSA/CPAP, CAD that is presenting to clinic today for cardiac sarcoidosis medication management.   Sarcoidosis biopsy proven in 2008 by mediastinal lymph node sampling. Cath done 02/2009 which showed tortuous LAD, 95% mid-distal LAD stenosis. Repeat cath for [facility] showed only 50% proximal-mid LAD stenosis unable to complete FFR as unable to wire vessel due to tortuosity. Cardiolite 09/2022 showed no ischemia or infarction. ECHO 10/2022 showed EF 55-60% with normal RV. Cardiac MRI 09/2023 showed EF 66%, mid-wall LGE in the basal anteroseptal wall. The LGE could be a lesion from cardiac sarcoidosis. Cardiac PET 10/2023 showed FDG uptake in the lateral wall base, apex, basal inferior wall, and anterolateral papillary muscle. Perfusion normal on PET and mediastinal FDG uptake noted in hilar nodes. This study is suggestive of active inflammation most likely from cardiac sarcoidosis.   Seen by Dr. Mitzie Anda on 11/25/2023. Patient was recommended to be started on prednisone, methotrexate, Bactrim prophylaxis, and a PPI.   Today Glenda Burton returns to Heart Failure Clinic for pharmacist medication management. Reports feeling ***. Denies lightheadedness, SOB, and LEE. Reports being able to complete all activities of daily living (ADLs). Is *** active throughout the day. Does not take weight at home. Takes no diuretic at home. Appetite good. Does not follow a low sodium diet.  Current Heart Failure Medications: Loop diuretic: none Beta-Blocker: metoprolol tartrate 100 mg BID  ACEI/ARB/ARNI: lisinopril 20 mg daily  MRA: none SGLT2i: none Other: isosorbide 50 mg daily, hydrochlorothiazide 12.5 mg daily   Has the patient been experiencing any side effects to the medications prescribed?  No  Does the patient have any problems obtaining medications due to transportation or finances? No  Understanding of regimen: Good  Understanding of indications: Good  Potential of adherence: Good  Patient understands to avoid NSAIDs.  Patient understands to avoid decongestants.  Pertinent Lab Values: Creatinine, Ser  Date Value Ref Range Status  11/25/2023 0.97 0.57 - 1.00 mg/dL Final   BUN  Date Value Ref Range Status  11/25/2023 19 6 - 24 mg/dL Final   Potassium  Date Value Ref Range Status  11/25/2023 4.5 3.5 - 5.2 mmol/L Final   Sodium  Date Value Ref Range Status  11/25/2023 144 134 - 144 mmol/L Final   BNP  Date Value Ref Range Status  11/25/2023 19.7 0.0 - 100.0 pg/mL Final    Comment:    [employer] ADVIA Centaur XP methodology   Magnesium  Date Value Ref Range Status  03/03/2009 1.8 1.5 - 2.5 mg/dL Final   TSH  Date Value Ref Range Status  03/03/2009 0.867 ***Test methodology is 3rd generation TSH*** 0.350 - 4.500 uIU/mL Final   Vital Signs: Today's Vitals   12/03/23 1109  BP: 132/80  Pulse: 76  SpO2: 96%  Weight: (!) 363 lb 3.2 oz (164.7 kg)   Assessment/Plan: 1. 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  She is not on immunosuppression. Cardiac MRI in 3/25 showed EF 66%, mid-wall LGE in the basal anteroseptal wall (though technically difficult study). The LGE could be a lesion from cardiac sarcoidosis. As a followup from this, cardiac PET was ordered. This showed FDG uptake in the lateral wall base=>apex, basal inferior wall, and anterolateral papillary muscle, perfusion normal.  Also mediastinal FDG uptake noted  in hilar nodes. This study is suggestive of active inflammation most likely from cardiac sarcoidosis, hilar nodes also involved.  Last CT chest in 2019 was unremarkable. Based on the evaluation so far, I suspect that the patient has active cardiac involvement by sarcoidosis.  No  evidence for arrhythmias.  - Start prednisone 30 mg daily and titrate down as instructed by protocol (see below).  - Start methotrexate 10 mg once weekly and titrate up as instructed by protocol (see below).  - Start folic acid 1 mg daily.  - Start omeprazole 20 mg daily.  - Start vitamin D 1000 units daily.   - Start calcium 1200 mg daily.  - Start Bactrim 800-160 mg every Monday, Wednesday, and Friday.  -Repeat cardiac PET in 6 months or so to assess for response. Follow CBC and LFTs.  - She will continue metoprolol.  - Refer to [facility] pulmonary for full evaluation for active pulmonary sarcoidosis.  2. OSA:  - Continue CPAP.  3. Morbid obesity:  - Start Mounjaro  2.5 mg once weekly.  4.  CAD:  LHC (8/10) with tortuous LAD, 95% mid-distal LAD stenosis. Repeat cath for [facility] showed only 50% proximal-mid LAD stenosis, unable to complete FFR as unable to wire vessel due to tortuosity. Cardiolite in 3/24 with no ischemia/infarction. No chest pain.  - Continue ASA 81 daily.  - Continue atorvastatin 40 daily, good lipids in 1/25.  5. HTN: BP controlled.  - Continue lisinopril. BMET today.  - Continue hydrochlorothiazide.   Follow up:  - PET in 6 months to assess for response - Return to HF pharmacist in two weeks.     Galvin Jules, Student Pharmacist

## 2023-12-03 NOTE — Progress Notes (Addendum)
 Advanced Heart Failure Clinic Note  PCP: [facility] PCP-Cardiologist: Sammy Crisp, MD HF-Cardiologist: Peder Bourdon, MD  HPI: Glenda Burton is a 58 YO female with a history of sarcoidosis, asthma, OSA/CPAP, CAD that is presenting to clinic today for cardiac sarcoidosis medication management.   Sarcoidosis was biopsy proven in 2008 by mediastinal lymph node sampling. Cath done 02/2009 which showed tortuous LAD, 95% mid-distal LAD stenosis. Repeat cath for [facility] showed only 50% proximal-mid LAD stenosis unable to complete FFR as unable to wire the vessel due to tortuosity. Cardiolite 09/2022 showed no ischemia or infarction. ECHO 10/2022 showed EF 55-60% with normal RV. Cardiac MRI 09/2023 showed EF 66%, mid-wall LGE in the basal anteroseptal wall. The LGE read as a potential lesion from cardiac sarcoidosis. Cardiac PET 10/2023 showed FDG uptake in the lateral wall base, apex, basal inferior wall, and anterolateral papillary muscle. Perfusion normal on PET and mediastinal FDG uptake noted in hilar nodes. This study is suggestive of active inflammation most likely from cardiac sarcoidosis.   Seen by Dr. Mitzie Anda on 11/25/2023. Patient was referred to pharmacy clinic to initiate the cardiac sarcoidosis protocol.   Today Glenda Burton returns to Heart Failure Clinic for pharmacist medication management. Reports feeling well overall. Reports mild orthopnea, mild dyspnea on exertion. Denies lightheadedness, SOB, palpitations, orthostasis, PND, chest pain, and LEE. Reports being able to complete all activities of daily living (ADLs). Is not very active throughout the day. Does not check her weight at home. Takes no diuretic at home. Appetite is good. Does not follow a low sodium diet.  Current Heart Failure Medications: Loop diuretic: none Beta-Blocker: metoprolol tartrate 100 mg BID  ACEI/ARB/ARNI: lisinopril 20 mg daily  MRA: none SGLT2i: none Other: isosorbide 60 mg daily,  hydrochlorothiazide 12.5 mg daily   Has the patient been experiencing any side effects to the medications prescribed? No  Does the patient have any problems obtaining medications due to transportation or finances? No  Understanding of regimen: Good  Understanding of indications: Good  Potential of adherence: Good  Patient understands to avoid NSAIDs.  Patient understands to avoid decongestants.  Pertinent Lab Values: Creatinine, Ser  Date Value Ref Range Status  11/25/2023 0.97 0.57 - 1.00 mg/dL Final   BUN  Date Value Ref Range Status  11/25/2023 19 6 - 24 mg/dL Final   Potassium  Date Value Ref Range Status  11/25/2023 4.5 3.5 - 5.2 mmol/L Final   Sodium  Date Value Ref Range Status  11/25/2023 144 134 - 144 mmol/L Final   BNP  Date Value Ref Range Status  11/25/2023 19.7 0.0 - 100.0 pg/mL Final    Comment:    [employer] ADVIA Centaur XP methodology   Magnesium  Date Value Ref Range Status  03/03/2009 1.8 1.5 - 2.5 mg/dL Final   TSH  Date Value Ref Range Status  03/03/2009 0.867 **Test methodology is 3rd generation TSH** 0.350 - 4.500 uIU/mL Final   Vital Signs: Today's Vitals   12/03/23 1109  BP: 132/80  Pulse: 76  SpO2: 96%  Weight: (!) 363 lb 3.2 oz (164.7 kg)   Assessment/Plan: 1. Cardiac sarcoidosis:  - Patient has biopsy-proven sarcoidosis from a mediastinal node biopsy in 2008.  She is not on immunosuppression currently. Cardiac MRI in 09/2023 showed EF 66%, mid-wall LGE in the basal anteroseptal wall (though technically difficult study). The LGE could be a lesion from cardiac sarcoidosis. As a followup from this, cardiac PET was ordered, showing FDG uptake in the lateral wall  base=>apex, basal inferior wall, and anterolateral papillary muscle, perfusion normal. Also mediastinal FDG uptake noted in hilar nodes. Noted by CHF MD to be suggestive of active inflammation most likely from cardiac sarcoidosis, hilar nodes also involved.  Last CT chest in 2019  was unremarkable.  - Start prednisone 30 mg daily and titrate down as instructed by protocol (see below).  - Start methotrexate 10 mg once weekly and titrate up as instructed by protocol (see below).  - For adverse effect mitigation, start folic acid 1 mg daily, start omeprazole 20 mg daily, vitamin D 1000 units daily, calcium 1200 mg daily, and  Bactrim 800-160 mg every Monday, Wednesday, and Friday.   -Repeat cardiac PET in 6 months or so to assess for response per MD. -Baseline LFTs and CBC ok. Check CBC and LFTs in 1 month.  -Given complex medication changes with initiation of cardiac sarcoidosis treatment, will not change other medications at this time. May eventually benefit from transition to SGLT2i, ARNI, and spironolactone. -Referred to [facility] pulmonary for full evaluation for active pulmonary sarcoidosis.  -Patient has received pneumonia and TDAP vaccinations and will acquire Shingrix in ~1 week. CHF MD recommended to proceed with treatment. 2. OSA:  - Continue CPAP.  - Start GLP-1 as below 3. DM + Morbid obesity:  - Start Mounjaro  2.5 mg once weekly.  4.  CAD:  LHC (02/2009) with tortuous LAD, 95% mid-distal LAD stenosis. Repeat cath for [facility] showed only 50% proximal-mid LAD stenosis, unable to complete FFR as unable to wire vessel due to tortuosity. Cardiolite in 3/24 with no ischemia/infarction. No chest pain.  - Continue ASA 81 daily.  - Continue atorvastatin 40 daily, good lipids in 07/2023.  5. HTN: BP controlled.  - Continue lisinopril.  - Continue hydrochlorothiazide.   Personalized Cardiac Sarcoid Treatment Protocol through 03/24/24  Prednisone  Weeks 1-4 5/7 through 6/3 30 mg (three 10 mg tablets) daily  Weeks 5-8 6/4 through 7/1 25 mg (two and a half 10 mg tablets) daily  Weeks 9-12 7/2 through 7/29 20 mg (two 10 mg tablets) daily  Weeks 13-16 7/30 through 8/26 15 mg (one and a half 10 mg tablets) daily   Methotrexate  Weeks 1-2 5/7 & 5/14 10 mg (four 2.5 mg tablets)  once weekly  Weeks 3-4 5/21 & 5/28 12.5 mg (five 2.5 mg tablets) once weekly  Weeks 5-6 6/4 & 6/11 15 mg (six 2.5 mg tablets) once weekly  Weeks 7-8 6/18 & 6/25 17.5 mg (seven 2.5 mg tablets) once weekly  Weeks 9+ 7/2 & on 20 mg (eight 2.5 mg tablets) once weekly   Until instructed otherwise, continue taking vitamin D [phone] IU daily, Calcium 1200-1500 mg daily, folic acid, and Omeprazole 20 mg daily, Bactrim every Monday, Wednesday, and Friday  Follow up:  - PET in 6 months to assess for response per MD - Return to HF pharmacist in two weeks.    Please do not hesitate to reach out with questions or concerns,  Bevely Brush, PharmD, CPP, BCPS Heart Failure Pharmacist  Phone - [phone] 12/03/2023 2:28 PM

## 2023-12-03 NOTE — Telephone Encounter (Signed)
 Advanced Heart Failure Patient Advocate Encounter  Test billing for this patients current coverage returns the following copays:  Entresto $12.15  Farxiga $4.90  Jardiance $12.15  Ethelene Herald $12.15   All pricing for 30 or 90 day supply. No medication assistance needed at this time.  Kennis Peacock, CPhT Rx Patient Advocate Phone: [phone]

## 2023-12-16 ENCOUNTER — Telehealth: Payer: Self-pay | Admitting: Pharmacist

## 2023-12-16 NOTE — Telephone Encounter (Signed)
 Called to confirm/remind patient of their appointment at the Advanced Heart Failure Clinic on 12/17/23.   Appointment:   [x] Confirmed  [] Left mess   [] No answer/No voice mail  [] VM Full/unable to leave message  [] Phone not in service  Patient reminded to bring all medications and/or complete list.  Confirmed patient has transportation. Gave directions, instructed to utilize valet parking.

## 2023-12-16 NOTE — Progress Notes (Deleted)
 Advanced Heart Failure Clinic Note  PCP: [facility] PCP-Cardiologist: Sammy Crisp, MD HF-Cardiologist: Peder Bourdon, MD  HPI: Glenda Burton is a 58 YO female with a history of sarcoidosis, asthma, OSA/CPAP, CAD that is presenting to clinic today for cardiac sarcoidosis medication management.    Sarcoidosis was biopsy proven in 2008 by mediastinal lymph node sampling. Cath done 02/2009 which showed tortuous LAD, 95% mid-distal LAD stenosis. Repeat cath for [facility] showed only 50% proximal-mid LAD stenosis unable to complete FFR as unable to wire the vessel due to tortuosity. Cardiolite 09/2022 showed no ischemia or infarction. ECHO 10/2022 showed EF 55-60% with normal RV. Cardiac MRI 09/2023 showed EF 66%, mid-wall LGE in the basal anteroseptal wall. The LGE read as a potential lesion from cardiac sarcoidosis. Cardiac PET 10/2023 showed FDG uptake in the lateral wall base, apex, basal inferior wall, and anterolateral papillary muscle. Perfusion normal on PET and mediastinal FDG uptake noted in hilar nodes. This study is suggestive of active inflammation most likely from cardiac sarcoidosis.   Last seen by HF PharmD on 12/03/2023 where prednisone , methotrexate , folic acid , omeprazole , vitamin D, calcium , and Bactrim  were started per sarcoidosis protocol.   Today Glenda Burton returns to Heart Failure Clinic for pharmacist medication titration. Reports feeling well overall. Reports mild orthopnea and mild dyspnea on exertion.  Denies dizziness, lightheadedness, fatigue, chest pain, palpitations, SOB, LEE, orthostasis, PND. Reports being able to complete all activities of daily living (ADLs). Is becoming more active throughout the day. Patient reports she's been able to walk more recently. Does not currently check weight at home but recently bought scales to start checking. Takes no diuretic at home. Appetite has decreased since starting Mounjaro . Does not follow a low sodium  diet.  Current Heart Failure Medications: Loop diuretic: none Beta-Blocker: metoprolol tartrate 100 mg BID ACEI/ARB/ARNI: lisinopril 20 mg daily  MRA: none  SGLT2i: none Other: hydrochlorothiazide 12.5 mg daily, Imdur 60 mg daily  Has the patient been experiencing any side effects to the medications prescribed? No  Does the patient have any problems obtaining medications due to transportation or finances? No  Understanding of regimen: Good  Understanding of indications: Good  Potential of adherence: Good  Patient understands to avoid NSAIDs.  Patient understands to avoid decongestants.  Pertinent Lab Values: Creatinine, Ser  Date Value Ref Range Status  11/25/2023 0.97 0.57 - 1.00 mg/dL Final   BUN  Date Value Ref Range Status  11/25/2023 19 6 - 24 mg/dL Final   Potassium  Date Value Ref Range Status  11/25/2023 4.5 3.5 - 5.2 mmol/L Final   Sodium  Date Value Ref Range Status  11/25/2023 144 134 - 144 mmol/L Final   BNP  Date Value Ref Range Status  11/25/2023 19.7 0.0 - 100.0 pg/mL Final    Comment:    [employer] ADVIA Centaur XP methodology   Magnesium  Date Value Ref Range Status  03/03/2009 1.8 1.5 - 2.5 mg/dL Final   TSH  Date Value Ref Range Status  03/03/2009 0.867 ***Test methodology is 3rd generation TSH*** 0.350 - 4.500 uIU/mL Final   Vital Signs: Today's Vitals   12/17/23 1317  BP: 126/82  Pulse: 100  SpO2: 98%  Weight: (!) 362 lb 3.2 oz (164.3 kg)   Assessment/Plan: 1. Cardiac sarcoidosis:  - Patient has biopsy-proven sarcoidosis from a mediastinal node biopsy in 2008.  She is not on immunosuppression currently. Cardiac MRI in 09/2023 showed EF 66%, mid-wall LGE in the basal anteroseptal wall (though technically  difficult study). The LGE could be a lesion from cardiac sarcoidosis. As a followup from this, cardiac PET was ordered, showing FDG uptake in the lateral wall base=>apex, basal inferior wall, and anterolateral papillary muscle,  perfusion normal. Also mediastinal FDG uptake noted in hilar nodes. Noted by CHF MD to be suggestive of active inflammation most likely from cardiac sarcoidosis, hilar nodes also involved.  Last CT chest in 2019 was unremarkable.  - Continue prednisone  30 mg daily and titrate down as instructed by protocol (see below).  - Start methotrexate  12.5 mg once weekly tomorrow and titrate up as instructed by protocol (see below).  - Continue folic acid  1 mg daily, start omeprazole  20 mg daily, vitamin D 1000 units daily, calcium  1200 mg daily, and  Bactrim  800-160 mg every Monday, Wednesday, and Friday.   -Repeat cardiac PET in 6 months or so to assess for response per MD.  -Baseline LFTs and CBC ok. Check CBC and LFTs ~1 hour prior to next visit with Dr. Mitzie Anda.  -Given complex medication changes with initiation of cardiac sarcoidosis treatment, will not change other medications at this time. May eventually benefit from transition to SGLT2i, ARNI, and spironolactone. -Referred to  pulmonary for full evaluation for active pulmonary sarcoidosis.  -Patient has received pneumonia and TDAP vaccinations prior to starting prednisone  and methotrexate . Received first dose of Shingrex series this morning.  2. OSA:  - Continue CPAP.  - Continue GLP-1 as below 3. DM + Morbid obesity:  - Increase Mounjaro  to 5 mg once weekly after finishing the 2.5 mg doses remaining. Patient reports no nausea or abdominal pain since starting medication.  4.  CAD:  LHC (02/2009) with tortuous LAD, 95% mid-distal LAD stenosis. Repeat cath for [facility] showed only 50% proximal-mid LAD stenosis, unable to complete FFR as unable to wire vessel due to tortuosity. Cardiolite in 3/24 with no ischemia/infarction. No chest pain.  - Continue ASA 81 daily.  - Continue atorvastatin 40 daily, good lipids in 07/2023.  5. HTN: BP controlled.  - Continue lisinopril.  - Continue hydrochlorothiazide.   Follow up: Next visit with Dr. Mitzie Anda  12/27/22. Get CBC and CMP ~1 hour prior to visit.   ***

## 2023-12-17 ENCOUNTER — Other Ambulatory Visit: Payer: Self-pay

## 2023-12-17 ENCOUNTER — Ambulatory Visit: Attending: Cardiology | Admitting: Pharmacist

## 2023-12-17 VITALS — BP 126/82 | HR 100 | Wt 362.2 lb

## 2023-12-17 DIAGNOSIS — Z79631 Long term (current) use of antimetabolite agent: Secondary | ICD-10-CM | POA: Diagnosis not present

## 2023-12-17 DIAGNOSIS — G4733 Obstructive sleep apnea (adult) (pediatric): Secondary | ICD-10-CM | POA: Insufficient documentation

## 2023-12-17 DIAGNOSIS — I2510 Atherosclerotic heart disease of native coronary artery without angina pectoris: Secondary | ICD-10-CM | POA: Insufficient documentation

## 2023-12-17 DIAGNOSIS — J45909 Unspecified asthma, uncomplicated: Secondary | ICD-10-CM | POA: Diagnosis not present

## 2023-12-17 DIAGNOSIS — Z79899 Other long term (current) drug therapy: Secondary | ICD-10-CM | POA: Diagnosis not present

## 2023-12-17 DIAGNOSIS — D8685 Sarcoid myocarditis: Secondary | ICD-10-CM | POA: Diagnosis present

## 2023-12-17 DIAGNOSIS — Z7985 Long-term (current) use of injectable non-insulin antidiabetic drugs: Secondary | ICD-10-CM | POA: Diagnosis not present

## 2023-12-17 DIAGNOSIS — Z7952 Long term (current) use of systemic steroids: Secondary | ICD-10-CM | POA: Diagnosis not present

## 2023-12-17 DIAGNOSIS — E119 Type 2 diabetes mellitus without complications: Secondary | ICD-10-CM | POA: Insufficient documentation

## 2023-12-17 DIAGNOSIS — I10 Essential (primary) hypertension: Secondary | ICD-10-CM | POA: Diagnosis not present

## 2023-12-17 MED ORDER — MOUNJARO 5 MG/0.5ML ~~LOC~~ SOAJ
5.0000 mg | SUBCUTANEOUS | 0 refills | Status: DC
  Filled 2023-12-17 – 2023-12-26 (×2): qty 2, 28d supply, fill #0

## 2023-12-17 NOTE — Patient Instructions (Signed)
 It was a pleasure seeing you today!  MEDICATIONS: -We are changing your medications today -Increase Mounjaro  to 5 mg weekly -Call if you have questions about your medications.  LABS: -Please check labs ~1 hour before your next appointment -We will call you if your labs need attention.  NEXT APPOINTMENT: Return to clinic 5/30 with Dr. Mitzie Anda.  In general, to take care of your heart failure: -Limit your fluid intake to 2 Liters (half-gallon) per day.   -Limit your salt intake to ideally 2-3 grams (2000-3000 mg) per day. -Weigh yourself daily and record, and bring that "weight diary" to your next appointment.  (Weight gain of 2-3 pounds in 1 day typically means fluid weight.) -The medications for your heart are to help your heart and help you live longer.   -Please contact us  before stopping any of your heart medications.  Call the clinic at [phone] with questions or to reschedule future appointments.

## 2023-12-17 NOTE — Progress Notes (Signed)
 Advanced Heart Failure Clinic Note  PCP: [facility] PCP-Cardiologist: Sammy Crisp, MD HF-Cardiologist: Peder Bourdon, MD  HPI: Glenda Burton is a 58 YO female with a history of sarcoidosis, asthma, OSA/CPAP, CAD that is presenting to clinic today for cardiac sarcoidosis medication management.    Sarcoidosis was biopsy proven in 2008 by mediastinal lymph node sampling. Cath done 02/2009 which showed tortuous LAD, 95% mid-distal LAD stenosis. Repeat cath for [facility] showed only 50% proximal-mid LAD stenosis unable to complete FFR as unable to wire the vessel due to tortuosity. Cardiolite 09/2022 showed no ischemia or infarction. ECHO 10/2022 showed EF 55-60% with normal RV. Cardiac MRI 09/2023 showed EF 66%, mid-wall LGE in the basal anteroseptal wall. The LGE read as a potential lesion from cardiac sarcoidosis. Cardiac PET 10/2023 showed FDG uptake in the lateral wall base, apex, basal inferior wall, and anterolateral papillary muscle. Perfusion normal on PET and mediastinal FDG uptake noted in hilar nodes. This study is suggestive of active inflammation most likely from cardiac sarcoidosis.   Last seen by HF PharmD on 12/03/2023 where prednisone , methotrexate , folic acid , omeprazole , vitamin D, calcium , and Bactrim  were started per sarcoidosis protocol.   Today Glenda Burton returns to Heart Failure Clinic for pharmacist medication titration. Reports feeling well overall. Reports mild orthopnea and mild dyspnea on exertion unchanged form last visit. Denies dizziness, lightheadedness, fatigue, chest pain, palpitations, SOB, LEE, orthostasis, PND. Reports being able to complete all activities of daily living (ADLs). Is becoming more active throughout the day. Patient reports she's been able to walk more recently. Does not currently check weight at home but recently bought a scale to start checking. Takes no diuretic at home. Appetite has decreased since starting Mounjaro . Does  not follow a low sodium diet.  Current Heart Failure Medications: Loop diuretic: none Beta-Blocker: metoprolol tartrate 100 mg BID ACEI/ARB/ARNI: lisinopril 20 mg daily  MRA: none  SGLT2i: none Other: hydrochlorothiazide 12.5 mg daily, Imdur 60 mg daily  Has the patient been experiencing any side effects to the medications prescribed? No  Does the patient have any problems obtaining medications due to transportation or finances? No  Understanding of regimen: Good  Understanding of indications: Good  Potential of adherence: Good  Patient understands to avoid NSAIDs.  Patient understands to avoid decongestants.  Pertinent Lab Values: Creatinine, Ser  Date Value Ref Range Status  11/25/2023 0.97 0.57 - 1.00 mg/dL Final   BUN  Date Value Ref Range Status  11/25/2023 19 6 - 24 mg/dL Final   Potassium  Date Value Ref Range Status  11/25/2023 4.5 3.5 - 5.2 mmol/L Final   Sodium  Date Value Ref Range Status  11/25/2023 144 134 - 144 mmol/L Final   BNP  Date Value Ref Range Status  11/25/2023 19.7 0.0 - 100.0 pg/mL Final    Comment:    [employer] ADVIA Centaur XP methodology   Magnesium  Date Value Ref Range Status  03/03/2009 1.8 1.5 - 2.5 mg/dL Final   TSH  Date Value Ref Range Status  03/03/2009 0.867 **Test methodology is 3rd generation TSH** 0.350 - 4.500 uIU/mL Final   Vital Signs: Today's Vitals   12/17/23 1317  BP: 126/82  Pulse: 100  SpO2: 98%  Weight: (!) 362 lb 3.2 oz (164.3 kg)   Assessment/Plan: 1. Cardiac sarcoidosis:  - Patient has biopsy-proven sarcoidosis from a mediastinal node biopsy in 2008.  She is not on immunosuppression currently. Cardiac MRI in 09/2023 showed EF 66%, mid-wall LGE in the basal  anteroseptal wall (though technically difficult study). The LGE could be a lesion from cardiac sarcoidosis. As a followup from this, cardiac PET was ordered, showing FDG uptake in the lateral wall base=>apex, basal inferior wall, and anterolateral  papillary muscle, perfusion normal. Also mediastinal FDG uptake noted in hilar nodes. Noted by CHF MD to be suggestive of active inflammation most likely from cardiac sarcoidosis, hilar nodes also involved.  Last CT chest in 2019 was unremarkable.  - Continue prednisone  30 mg daily and titrate down as instructed by protocol (see below). Patient is tolerating this well thus far. - Start methotrexate  12.5 mg once weekly tomorrow and titrate up as instructed by protocol (see below). Patient is tolerating this well thus far. - Continue folic acid  1 mg daily, start omeprazole  20 mg daily, vitamin D 1000 units daily, calcium  1200 mg daily, and Bactrim  800-160 mg every Monday, Wednesday, and Friday.   -Repeat cardiac PET in 6 months to assess for response per MD.  -Baseline LFTs and CBC ok. Check CBC and LFTs ~1 hour prior to next visit with Dr. Mitzie Anda.  -Given complex medication changes with initiation of cardiac sarcoidosis treatment, will not change other medications at this time. May eventually benefit from transition to SGLT2i, ARNI, and spironolactone, though diastolic dysfunction has not been previously noted. -Referred to [facility] for full evaluation for active pulmonary sarcoidosis.  -Patient has received pneumonia and TDAP vaccinations prior to starting prednisone  and methotrexate . Received first dose of Shingrex series this morning.  2. OSA:  - Continue CPAP.  - Continue GLP-1 as below 3. DM + Morbid obesity:  - Increase Mounjaro  to 5 mg once weekly after finishing the 2.5 mg doses remaining. Patient denies nausea or abdominal pain since starting medication.  4.  CAD:  LHC (02/2009) with tortuous LAD, 95% mid-distal LAD stenosis. Repeat cath for [facility] showed only 50% proximal-mid LAD stenosis, unable to complete FFR as unable to wire vessel due to tortuosity. Cardiolite in 09/2022 with no ischemia/infarction. No chest pain.  - Continue ASA 81 daily.  - Continue atorvastatin 40 daily, good  lipids in 07/2023.  5. HTN: BP controlled.  - Continue lisinopril.  - Continue hydrochlorothiazide.   Personalized Cardiac Sarcoid Treatment Protocol    Prednisone   Weeks 1-4 5/7 through 6/3 30 mg (three 10 mg tablets) daily  Weeks 5-8 6/4 through 7/1 25 mg (two and a half 10 mg tablets) daily  Weeks 9-12 7/2 through 7/29 20 mg (two 10 mg tablets) daily  Weeks 13-16 7/30 through 8/26 15 mg (one and a half 10 mg tablets) daily *Stop Bactrim   Weeks 17 and on 8/27 until told otherwise 10 mg  (one 10 mg tablet)    Methotrexate   Weeks 1-2 5/7 & 5/14 10 mg (four 2.5 mg tablets) once weekly  Weeks 3-4 5/21 & 5/28 12.5 mg (five 2.5 mg tablets) once weekly  Weeks 5-6 6/4 & 6/11 15 mg (six 2.5 mg tablets) once weekly  Weeks 7-8 6/18 & 6/25 17.5 mg (seven 2.5 mg tablets) once weekly  Weeks 9+ 7/2 & on 20 mg (eight 2.5 mg tablets) once weekly    Until instructed otherwise, continue taking vitamin D [phone] IU daily, Calcium  1200-1500 mg daily, folic acid , and Omeprazole  20 mg daily   Follow up: Next visit with Dr. Mitzie Anda 12/27/22. Get CBC and CMP ~1 hour prior to visit.   Please do not hesitate to reach out with questions or concerns,  Bevely Brush, PharmD, CPP, BCPS Heart Failure  Pharmacist  Phone - [phone] 12/17/2023 1:58 PM

## 2023-12-24 ENCOUNTER — Telehealth: Payer: Self-pay | Admitting: Cardiology

## 2023-12-24 NOTE — Telephone Encounter (Signed)
 Called to confirm/remind patient of their appointment at the Advanced Heart Failure Clinic on 12/25/23.   Appointment:   [x] Confirmed  [] Left mess   [] No answer/No voice mail  [] VM Full/unable to leave message  [] Phone not in service  Patient reminded to bring all medications and/or complete list.  Confirmed patient has transportation. Gave directions, instructed to utilize valet parking.

## 2023-12-25 ENCOUNTER — Ambulatory Visit (HOSPITAL_BASED_OUTPATIENT_CLINIC_OR_DEPARTMENT_OTHER): Admitting: Cardiology

## 2023-12-25 ENCOUNTER — Ambulatory Visit (HOSPITAL_COMMUNITY): Payer: Self-pay | Admitting: Cardiology

## 2023-12-25 ENCOUNTER — Encounter: Payer: Self-pay | Admitting: Cardiology

## 2023-12-25 ENCOUNTER — Other Ambulatory Visit: Payer: Self-pay

## 2023-12-25 ENCOUNTER — Other Ambulatory Visit
Admission: RE | Admit: 2023-12-25 | Discharge: 2023-12-25 | Disposition: A | Discharge status: Home / Self Care | Attending: Cardiology | Admitting: Cardiology

## 2023-12-25 VITALS — BP 109/57 | HR 98 | Wt 358.5 lb

## 2023-12-25 DIAGNOSIS — Z7952 Long term (current) use of systemic steroids: Secondary | ICD-10-CM | POA: Insufficient documentation

## 2023-12-25 DIAGNOSIS — Z6841 Body Mass Index (BMI) 40.0 and over, adult: Secondary | ICD-10-CM | POA: Diagnosis not present

## 2023-12-25 DIAGNOSIS — D519 Vitamin B12 deficiency anemia, unspecified: Secondary | ICD-10-CM

## 2023-12-25 DIAGNOSIS — D8685 Sarcoid myocarditis: Secondary | ICD-10-CM

## 2023-12-25 DIAGNOSIS — Z79899 Other long term (current) drug therapy: Secondary | ICD-10-CM | POA: Insufficient documentation

## 2023-12-25 DIAGNOSIS — D649 Anemia, unspecified: Secondary | ICD-10-CM | POA: Diagnosis present

## 2023-12-25 DIAGNOSIS — I10 Essential (primary) hypertension: Secondary | ICD-10-CM | POA: Insufficient documentation

## 2023-12-25 DIAGNOSIS — Z7982 Long term (current) use of aspirin: Secondary | ICD-10-CM | POA: Insufficient documentation

## 2023-12-25 DIAGNOSIS — G4733 Obstructive sleep apnea (adult) (pediatric): Secondary | ICD-10-CM | POA: Insufficient documentation

## 2023-12-25 DIAGNOSIS — I2510 Atherosclerotic heart disease of native coronary artery without angina pectoris: Secondary | ICD-10-CM | POA: Diagnosis not present

## 2023-12-25 DIAGNOSIS — I25118 Atherosclerotic heart disease of native coronary artery with other forms of angina pectoris: Secondary | ICD-10-CM | POA: Diagnosis not present

## 2023-12-25 LAB — CBC
HCT: 21.6 % — ABNORMAL LOW (ref 36.0–46.0)
HCT: 22.3 % — ABNORMAL LOW (ref 36.0–46.0)
Hemoglobin: 6.1 g/dL — ABNORMAL LOW (ref 12.0–15.0)
Hemoglobin: 6.4 g/dL — ABNORMAL LOW (ref 12.0–15.0)
MCH: 26.9 pg (ref 26.0–34.0)
MCH: 27.1 pg (ref 26.0–34.0)
MCHC: 28.2 g/dL — ABNORMAL LOW (ref 30.0–36.0)
MCHC: 28.7 g/dL — ABNORMAL LOW (ref 30.0–36.0)
MCV: 95.2 fL (ref 80.0–100.0)
MCV: 94.5 fL (ref 80.0–100.0)
Platelets: 234 10*3/uL (ref 150–400)
Platelets: 233 10*3/uL (ref 150–400)
RBC: 2.27 MIL/uL — ABNORMAL LOW (ref 3.87–5.11)
RBC: 2.36 MIL/uL — ABNORMAL LOW (ref 3.87–5.11)
RDW: 17.7 % — ABNORMAL HIGH (ref 11.5–15.5)
RDW: 18 % — ABNORMAL HIGH (ref 11.5–15.5)
WBC: 15.6 10*3/uL — ABNORMAL HIGH (ref 4.0–10.5)
WBC: 16.9 10*3/uL — ABNORMAL HIGH (ref 4.0–10.5)
nRBC: 0.1 % (ref 0.0–0.2)
nRBC: 0.1 % (ref 0.0–0.2)

## 2023-12-25 LAB — COMPREHENSIVE METABOLIC PANEL WITH GFR
ALT: 19 U/L (ref 0–44)
AST: 14 U/L — ABNORMAL LOW (ref 15–41)
Albumin: 3.4 g/dL — ABNORMAL LOW (ref 3.5–5.0)
Alkaline Phosphatase: 62 U/L (ref 38–126)
Anion gap: 5 (ref 5–15)
BUN: 42 mg/dL — ABNORMAL HIGH (ref 6–20)
CO2: 27 mmol/L (ref 22–32)
Calcium: 9.2 mg/dL (ref 8.9–10.3)
Chloride: 109 mmol/L (ref 98–111)
Creatinine, Ser: 1.43 mg/dL — ABNORMAL HIGH (ref 0.44–1.00)
GFR, Estimated: 42 mL/min — ABNORMAL LOW (ref >60)
Glucose, Bld: 88 mg/dL (ref 70–99)
Potassium: 4 mmol/L (ref 3.5–5.1)
Sodium: 141 mmol/L (ref 135–145)
Total Bilirubin: 0.3 mg/dL (ref 0.0–1.2)
Total Protein: 6.6 g/dL (ref 6.5–8.1)

## 2023-12-25 LAB — IRON AND TIBC
Iron: 18 ug/dL — ABNORMAL LOW (ref 28–170)
Saturation Ratios: 5 % — ABNORMAL LOW (ref 10.4–31.8)
TIBC: 381 ug/dL (ref 250–450)
UIBC: 363 ug/dL

## 2023-12-25 LAB — FERRITIN: Ferritin: 12 ng/mL (ref 11–307)

## 2023-12-25 MED ORDER — IRON SUCROSE 500 MG IVPB - SIMPLE MED
500.0000 mg | Freq: Once | INTRAVENOUS | Status: DC
  Filled 2023-12-25: qty 275

## 2023-12-25 NOTE — Addendum Note (Signed)
 Addended by: Autry Legions A on: 12/25/2023 03:55 PM   Modules accepted: Orders

## 2023-12-25 NOTE — Progress Notes (Signed)
 Orders for iron infusion, prbc transfusion and type and screen placed.

## 2023-12-25 NOTE — Patient Instructions (Signed)
 Medication Changes:  STOP Hydrochlorothiazide  Lab Work:  Go over to the MEDICAL MALL. Go pass the gift shop and have your blood work completed.  We will only call you if the results are abnormal or if the provider would like to make medication changes.   Testing/Procedures:   Cardiac Sarcoidosis/Inflammation PET Scan Patient Instructions  Please report to Radiology at the [facility] Main Entrance 15 minutes early for your test.  [address] [place], [place] [address] BRING FOOD DIARY WITH YOU TO THIS APPOINTMENT For 24 hours before the test: Do not exercise! Do not eat after 5 pm the day before your test! To make sure that your scanning results are accurate, you MUST follow the sarcoid prep meal diet starting the day before your PET scan. This diet involves eating no carbohydrates 24 hours before the test.  You will keep a log of all that you eat the day before your test. If you have questions or do not understand this diet, please call [phone] for more information. If you are unable to follow this diet, please discuss an alternative strategy with the coordinator.  If you are diabetic, continue your diabetes medications as usual on the day before until you begin to fast. NO DIABETES MEDICATIONS ONCE YOU BEGIN TO FAST. What foods can I eat the day before my test?  Drink only water or black coffee (WITHOUT sugar, artificial sweetener, cream, or milk). Eggs (prepared without milk or cheese)  Meat that is either broiled or pan fried in butter WITHOUT breading (chicken, [place], bacon, meat-only sausage, hamburger, steak, fish) Butter, salt & pepper What foods must I AVOID the day before my test?  Do not consume alcoholic beverages, sodas, fruit juice, coffee creamer, or sports drinks  Do not eat vegetables, beans, nuts, fruits, juices, bread, grains, rice, pasta, potatoes, or any baked goods Do not eat dairy products (milk, cheese, etc.)  Do not eat mayonnaise,  ketchup, tartar sauce, mustard, or other condiments Do not add sugar, artificial sweeteners, or Splenda (sucralose) to foods or drinks  Do not eat breaded foods (like fried chicken)  Do not eat sweets, candy, gum, sweetened cough drops, lozenges, or sugar  Do not eat sweetened, grilled, or cured meats or meat with carbohydrate-containing additives (some sausages, ham, sweetened bacon) Suggested items for breakfast, lunch, or dinner:  Breakfast  3 to 5 fatty sausage links fried in butter. 3 to 5 bacon strips.  3 eggs pan fried in butter (no milk or cheese).  Lunch/Dinner  2 hamburger patties fried in butter. Chicken or fatty fish pan fried in butter. No breading. 8 oz. fatty steak pan fried in butter.  Beverages  Drink only water or black coffee. DO NOT ADD SUGAR, ARTIFICIAL SWEETENER, CREAM, OR MILK  Please call [facility] to cancel or reschedule your appointment at [phone] For more information and frequently asked questions, please visit our website : [email or web address]   Special Instructions // Education:  Please arrive at the medical mall tomorrow at 8am in order to receive blood. You will be reporting to the same day surgery.  Follow-Up in: Please follow up with the Advanced Heart Failure Clinic in 3 weeks with Shawnee Dellen, FNP.  At the Advanced Heart Failure Clinic, you and your health needs are our priority. We have a designated team specialized in the treatment of Heart Failure. This Care Team includes your primary Heart Failure Specialized Cardiologist (physician), Advanced Practice Providers (APPs- Physician Assistants and Nurse  Practitioners), and Pharmacist who all work together to provide you with the care you need, when you need it.   You may see any of the following providers on your designated Care Team at your next follow up:  Dr. Jules Oar Dr. Peder Bourdon Dr. Alwin Baars Dr. Judyth Nunnery Shawnee Dellen, FNP Bevely Brush, RPH-CPP  Please be sure to bring in all your medications bottles to every appointment.   Need to Contact Us :  If you have any questions or concerns before your next appointment please send us  a message through [facility] or call our office at [phone].    TO LEAVE A MESSAGE FOR THE NURSE SELECT OPTION 2, PLEASE LEAVE A MESSAGE INCLUDING: YOUR NAME DATE OF BIRTH CALL BACK NUMBER REASON FOR CALL**this is important as we prioritize the call backs  YOU WILL RECEIVE A CALL BACK THE SAME DAY AS LONG AS YOU CALL BEFORE 4:00 PM

## 2023-12-25 NOTE — Progress Notes (Signed)
 PCP: [facility] Cardiology: Dr. Mitzie Anda  Chief complaint: Anemia  58 y.o. 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  Patient had a cath in 8/10 showing tortuous LAD, 95% mid-distal LAD stenosis.  Repeat cath for [facility] showed only 50% proximal-mid LAD stenosis, unable to complete FFR as unable to wire vessel due to tortuosity.  Cardiolite in 3/24 showed no ischemia or infarction.  Patient had a mediastinoscopy done in 2008 with mediastinal lymph node biopsy showing evidence for sarcoidosis. She says she was treated remotely with prednisone  but has had no recent immunosuppressant therapy.  She has been undergoing evaluation for bariatric surgery but ultimately has decided that she does not want it.   Patient was seen recently by Varney Gentleman.  Given history of sarcoidosis, he ordered a cardiac MRI to look for cardiac involvement.   This showed EF 66%, mid-wall LGE in the basal anteroseptal wall (though technically difficult study).  The LGE could be a lesion from cardiac sarcoidosis.  As a followup from this, cardiac PET was ordered.  This showed FDG uptake in the lateral wall base=>apex, basal inferior wall, and anterolateral papillary muscle, perfusion normal.  Also mediastinal FDG uptake noted in hilar nodes. This study is suggestive of active inflammation most likely from cardiac sarcoidosis, hilar nodes also involved.  Patient presents here today for management of cardiac sarcoidosis.  Weight is down 4 lbs.  Main complaint is fatigue.  She has mild dyspnea walking across her house.  No chest pain.  No lightheadedness.  She is using her CPAP.  She reports heavy vaginal bleeding (says that she still has her menstrual cycle) and hgb is 6.1 today with elevated WBCs and normal plts.  No BRBPR/melena. She is currently on methotrexate  12.5 mg weekly and prednisone  30 mg daily for cardiac sarcoidosis.   ECG (personally  reviewed): NSR, normal  Labs (1/25): LDL 57, K 3.8, creatinine 1.61 Labs (5/25): K 4, creatinine 1.43, LFTs normal, WBCs 15.6, hgb 6.1, plts 234  PMH: 1. Sarcoidosis: Biopsy-proven in 2008 by mediastinal lymph node sampling.  Possible cardiac involvement.  - Cardiac MRI (3/25) with EF 66%, mid-wall LGE in the basal anteroseptal wall (though technically difficult study).  This could be a lesion from cardiac sarcoidosis.  - Cardiac PET (4/25): FDG uptake in the lateral wall base=>apex, basal inferior wall, and anterolateral papillary muscle, perfusion normal.  Also mediastinal FDG uptake noted in hilar nodes. This study is suggestive of active inflammation possibly from cardiac sarcoidosis.  EF 70%.  2. OSA: Uses CPAP.  3. Obesity 4. Asthma 5. ABIs normal in 9/21. 6. HTN 7. Hyperlipidemia 8. CAD: LHC (8/10) with tortuous LAD, 95% mid-distal LAD stenosis.  Repeat cath for [facility] showed only 50% proximal-mid LAD stenosis, unable to complete FFR as unable to wire vessel due to tortuosity.  - Cardiolite (3/24): No ischemia/infarction.  - Echo (4/24): EF 55-60%, normal RV.   FH: Aunt with sarcoidosis.   ROS: All systems reviewed and negative except as per HPI.   Social History   Socioeconomic History   Marital status: Married    Spouse name: Not on file   Number of children: Not on file   Years of education: Not on file   Highest education level: Not on file  Occupational History   Not on file  Tobacco Use   Smoking status: Never   Smokeless tobacco: Never  Vaping Use   Vaping status:  Never Used  Substance and Sexual Activity   Alcohol use: No   Drug use: No   Sexual activity: Not on file  Other Topics Concern   Not on file  Social History Narrative   Not on file   Social Drivers of Health   Financial Resource Strain: Low Risk  (09/24/2022)   [facility]   Overall Financial Resource Strain (CARDIA)    Difficulty of Paying Living Expenses: Not hard at all   Food Insecurity: No Food Insecurity (09/24/2022)   Received from [facility]   Hunger Vital Sign    Worried About Running Out of Food in the Last Year: Never true    Ran Out of Food in the Last Year: Never true  Transportation Needs: No Transportation Needs (09/24/2022)   Received from [facility] - Transportation    Lack of Transportation (Medical): No    Lack of Transportation (Non-Medical): No  Physical Activity: Not on file  Stress: Not on file  Social Connections: Unknown (11/30/2021)   [facility]   Social Network    Social Network: Not on file  Intimate Partner Violence: Unknown (11/03/2021)   Received from [facility]   HITS    Physically Hurt: Not on file    Insult or Talk Down To: Not on file    Threaten Physical Harm: Not on file    Scream or Curse: Not on file    Current Outpatient Medications  Medication Sig Dispense Refill   ACETAMINOPHEN EXTRA STRENGTH 500 MG tablet Take 1,000 mg by mouth every 8 (eight) hours as needed.     Albuterol Sulfate (PROVENTIL HFA IN) Inhale 2 puffs into the lungs every other day as needed.     aspirin EC 81 MG tablet Take 81 mg by mouth daily.     atorvastatin (LIPITOR) 40 MG tablet Take 40 mg by mouth daily.      Calcium  200 MG TABS Take 6 tablets (1,200 mg total) by mouth daily.     celecoxib (CELEBREX) 100 MG capsule Take 100 mg by mouth 2 (two) times daily.     cholecalciferol (VITAMIN D) 1000 units tablet Take 1,000 Units by mouth daily.     fluticasone (FLONASE) 50 MCG/ACT nasal spray as needed.     folic acid  (FOLVITE ) 1 MG tablet Take 1 tablet (1 mg total) by mouth daily. 90 tablet 3   Glucos-Chondroit-Hyaluron-MSM (GLUCOSAMINE CHONDROITIN JOINT PO) Take by mouth daily.     isosorbide mononitrate (IMDUR) 60 MG 24 hr tablet Take 60 mg by mouth daily.     lisinopril (PRINIVIL,ZESTRIL) 20 MG tablet Take 20 mg by mouth daily.      methotrexate  (RHEUMATREX) 2.5 MG tablet Take  10 mg (four tablets) once weekly for 2 weeks, then 12.5 mg (5 tablets) once weekly for 2 weeks, then as instructed by heart failure clinic 18 tablet 1   metoprolol tartrate (LOPRESSOR) 100 MG tablet Take 100 mg by mouth 2 (two) times daily.      Multiple Vitamin (DAILY VITE PO) Take 1 tablet by mouth daily.     nitroGLYCERIN (NITROSTAT) 0.4 MG SL tablet      NON FORMULARY cpap device     omeprazole  (PRILOSEC) 20 MG capsule Take 1 capsule (20 mg total) by mouth daily. 30 capsule 11   predniSONE  (DELTASONE ) 10 MG tablet Take prednisone  30 mg daily, then as directed by heart failure clinic 90 tablet 2  sulfamethoxazole -trimethoprim  (BACTRIM  DS) 800-160 MG tablet Take 1 tablet by mouth every Monday, Wednesday, and Friday. 12 tablet 3   tirzepatide  (MOUNJARO ) 5 MG/0.5ML Pen Inject 5 mg into the skin once a week. 2 mL 0   topiramate (TOPAMAX) 50 MG tablet Take 50 mg by mouth 2 (two) times daily.     Current Facility-Administered Medications  Medication Dose Route Frequency Provider Last Rate Last Admin   iron sucrose (VENOFER) 500 mg in sodium chloride 0.9 % 250 mL IVPB  500 mg Intravenous Once Roldan Laforest S, MD       BP (!) 109/57 (BP Location: Left Arm, Patient Position: Sitting, Cuff Size: Large)   Pulse 98   Wt (!) 358 lb 8 oz (162.6 kg)   SpO2 100%   BMI 67.74 kg/m  General: NAD, obese.  Neck: Thick. JVP difficult, no thyromegaly or thyroid nodule.  Lungs: Clear to auscultation bilaterally with normal respiratory effort. CV: Nondisplaced PMI.  Heart regular S1/S2, no S3/S4, no murmur.  No peripheral edema.  No carotid bruit.  Normal pedal pulses.  Abdomen: Soft, nontender, no hepatosplenomegaly, no distention.  Skin: Intact without lesions or rashes.  Neurologic: Alert and oriented x 3.  Psych: Normal affect. Extremities: No clubbing or cyanosis.  HEENT: Normal.   1. Anemia: Patient is markedly anemic today, hgb 6.1. This was repeated and appears accurate.  She is not very  symptomatic.  WBCs and platelets are not low, so I think this is less likely a hematologic effect of methotrexate .  She has been having heavy vaginal bleeding, she says that she still has her menses.  - I will arrange for 2 units PRBCs tomorrow morning.  - She is iron deficient as well, will arrange for IV Fe.  - She needs workup of vaginal bleeding.  She will call her PCP for asap appointment.  2. Cardiac sarcoidosis: Patient has biopsy-proven sarcoidosis from a mediastinal node biopsy in 2008. Cardiac MRI in 3/25 showed EF 66%, mid-wall LGE in the basal anteroseptal wall (though technically difficult study).  The LGE could be a lesion from cardiac sarcoidosis.  As a followup from this, cardiac PET was ordered.  This showed FDG uptake in the lateral wall base=>apex, basal inferior wall, and anterolateral papillary muscle, perfusion normal.  Also mediastinal FDG uptake noted in hilar nodes. This study is suggestive of active inflammation most likely from cardiac sarcoidosis, hilar nodes also involved.  Last CT chest in 2019 was unremarkable.  I suspect that the patient has active cardiac involvement by sarcoidosis.  No evidence for arrhythmias.  She has been started on methotrexate  + prednisone  and is now on Bactrim  prophylaxis, meds are being titrated using our cardiac sarcoidosis protocol.  - Continue to adjust methotrexate  and prednisone  based on sarcoidosis protocol. Follow CBC and LFTs. As above, I do not think that her CBC abnormality is due to methotrexate  but will need to follow closely.  - She will continue metoprolol.  - She has been refered to [facility] pulmonary for full evaluation for active pulmonary sarcoidosis.  2. OSA: Continue CPAP.  3. Morbid obesity: She has decided against bariatric surgery for now.  - She is now on tirzepatide , continue.  4.  CAD: LHC (8/10) with tortuous LAD, 95% mid-distal LAD stenosis.  Repeat cath for [facility] showed only 50% proximal-mid LAD stenosis, unable to  complete FFR as unable to wire vessel due to tortuosity. Cardiolite in 3/24 with no ischemia/infarction. No chest pain.  - Continue ASA 81 daily.  -  Continue atorvastatin 40 daily, good lipids in 1/25.  5. HTN: BP is not elevated.  - With rise in creatinine in setting of anemia, stop hydrochlorothiazide.  - Continue lisinopril for now.   Followup in 3 wks with APP.   I spent 41 minutes reviewing records, interviewing/examining patient, and managing orders.   Peder Bourdon 12/25/2023

## 2023-12-26 ENCOUNTER — Ambulatory Visit
Admission: RE | Admit: 2023-12-26 | Discharge: 2023-12-26 | Disposition: A | Discharge status: Home / Self Care | Source: Ambulatory Visit | Attending: Cardiology | Admitting: Cardiology

## 2023-12-26 ENCOUNTER — Other Ambulatory Visit: Payer: Self-pay

## 2023-12-26 DIAGNOSIS — D649 Anemia, unspecified: Secondary | ICD-10-CM | POA: Diagnosis present

## 2023-12-26 LAB — PREPARE RBC (CROSSMATCH)

## 2023-12-26 MED ORDER — IRON SUCROSE 500 MG IVPB - SIMPLE MED
500.0000 mg | Freq: Once | INTRAVENOUS | Status: AC
  Administered 2023-12-26: 500 mg via INTRAVENOUS
  Filled 2023-12-26: qty 500

## 2023-12-26 MED ORDER — SODIUM CHLORIDE 0.9 % IV SOLN: 10.0000 mL/h | Freq: Once | INTRAVENOUS | Status: DC

## 2023-12-27 ENCOUNTER — Encounter: Admitting: Cardiology

## 2023-12-27 LAB — BPAM RBC
Blood Product Expiration Date: 202506172359
Blood Product Expiration Date: 202506252359
ISSUE DATE / TIME: 202505291011
ISSUE DATE / TIME: 202505291250
Unit Type and Rh: 1700
Unit Type and Rh: 7300

## 2023-12-27 LAB — TYPE AND SCREEN
ABO/RH(D): B POS
Antibody Screen: NEGATIVE
Unit division: 0
Unit division: 0

## 2023-12-30 ENCOUNTER — Other Ambulatory Visit: Payer: Self-pay

## 2023-12-30 ENCOUNTER — Other Ambulatory Visit: Payer: Self-pay | Admitting: Family Medicine

## 2023-12-30 DIAGNOSIS — N924 Excessive bleeding in the premenopausal period: Secondary | ICD-10-CM

## 2024-01-06 ENCOUNTER — Other Ambulatory Visit
Admission: RE | Admit: 2024-01-06 | Discharge: 2024-01-06 | Disposition: A | Discharge status: Home / Self Care | Source: Ambulatory Visit | Attending: Cardiology | Admitting: Cardiology

## 2024-01-06 ENCOUNTER — Ambulatory Visit (HOSPITAL_COMMUNITY): Payer: Self-pay | Admitting: Cardiology

## 2024-01-06 DIAGNOSIS — D649 Anemia, unspecified: Secondary | ICD-10-CM | POA: Insufficient documentation

## 2024-01-06 LAB — CBC
HCT: 30.2 % — ABNORMAL LOW (ref 36.0–46.0)
Hemoglobin: 9.2 g/dL — ABNORMAL LOW (ref 12.0–15.0)
MCH: 28.7 pg (ref 26.0–34.0)
MCHC: 30.5 g/dL (ref 30.0–36.0)
MCV: 94.1 fL (ref 80.0–100.0)
Platelets: 176 10*3/uL (ref 150–400)
RBC: 3.21 MIL/uL — ABNORMAL LOW (ref 3.87–5.11)
RDW: 18.6 % — ABNORMAL HIGH (ref 11.5–15.5)
WBC: 13.9 10*3/uL — ABNORMAL HIGH (ref 4.0–10.5)
nRBC: 0.6 % — ABNORMAL HIGH (ref 0.0–0.2)

## 2024-01-08 ENCOUNTER — Other Ambulatory Visit: Payer: Self-pay | Admitting: Cardiology

## 2024-01-08 ENCOUNTER — Ambulatory Visit
Admission: RE | Admit: 2024-01-08 | Discharge: 2024-01-08 | Disposition: A | Discharge status: Home / Self Care | Source: Ambulatory Visit | Attending: Family Medicine | Admitting: Family Medicine

## 2024-01-08 DIAGNOSIS — N924 Excessive bleeding in the premenopausal period: Secondary | ICD-10-CM | POA: Insufficient documentation

## 2024-01-09 ENCOUNTER — Other Ambulatory Visit: Payer: Self-pay

## 2024-01-10 ENCOUNTER — Other Ambulatory Visit: Payer: Self-pay

## 2024-01-10 ENCOUNTER — Encounter: Payer: Self-pay | Admitting: Pharmacist

## 2024-01-10 MED ORDER — METHOTREXATE SODIUM 2.5 MG PO TABS: ORAL_TABLET | ORAL | 1 refills | Status: DC

## 2024-01-10 MED ORDER — METHOTREXATE SODIUM 2.5 MG PO TABS
ORAL_TABLET | ORAL | 0 refills | Status: DC
  Filled 2024-01-10: qty 18, 21d supply, fill #0
  Filled 2024-01-10: qty 18, fill #0

## 2024-01-14 ENCOUNTER — Other Ambulatory Visit: Payer: Self-pay

## 2024-01-20 ENCOUNTER — Telehealth: Payer: Self-pay | Admitting: Family

## 2024-01-20 NOTE — Progress Notes (Unsigned)
 Advanced Heart Failure Clinic Note    PCP: [facility] HF Cardiology: Dr. Rolan  Chief complaint: shortness of breath   HPI:  Ms Vangorden is a 58 y.o. 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  Patient had a cath in 8/10 showing tortuous LAD, 95% mid-distal LAD stenosis.  Repeat cath for [facility] showed only 50% proximal-mid LAD stenosis, unable to complete FFR as unable to wire vessel due to tortuosity.  Cardiolite in 3/24 showed no ischemia or infarction.  Patient had a mediastinoscopy done in 2008 with mediastinal lymph node biopsy showing evidence for sarcoidosis. She says she was treated remotely with prednisone  but has had no recent immunosuppressant therapy.  She has been undergoing evaluation for bariatric surgery but ultimately has decided that she does not want it.   Patient was seen by Bernardino Bring PA 01/25.  Given history of sarcoidosis, he ordered a cardiac MRI to look for cardiac involvement.   This showed EF 66%, mid-wall LGE in the basal anteroseptal wall (though technically difficult study).  The LGE could be a lesion from cardiac sarcoidosis.  As a followup from this, cardiac PET was ordered.  This showed FDG uptake in the lateral wall base=>apex, basal inferior wall, and anterolateral papillary muscle, perfusion normal.  Also mediastinal FDG uptake noted in hilar nodes. This study is suggestive of active inflammation most likely from cardiac sarcoidosis, hilar nodes also involved.  Seen in [facility] 05/25 and found to be acutely anemic. Was ordered 2 unit PRBC's & iron  infusion with subsequent improvement of hemoglobin.    She presents today, with her daughter, for a HF visit with a chief complaint of moderate shortness of breath. Has associated fatigue, occasional palpitations. Sleeping well with CPAP nightly. Denies chest pain, abdominal distention, pedal edema, dizziness. Had pelvic  ultrasound done 01/08/24 and found to have thickened irregular endometrium without discrete mass.   Has not taken meds yet this morning. NAS. Drinking 3-4 water bottles daily.    She is currently on methotrexate  17.5 mg weekly and prednisone  25 mg daily for cardiac sarcoidosis.   ECG 12/25/23: NSR, normal  Labs (1/25): LDL 57, K 3.8, creatinine 9.02 Labs (5/25): K 4, creatinine 1.43, LFTs normal, WBCs 15.6, hgb 6.1, plts 234  PMH: 1. Sarcoidosis: Biopsy-proven in 2008 by mediastinal lymph node sampling.  Possible cardiac involvement.  - Cardiac MRI (3/25) with EF 66%, mid-wall LGE in the basal anteroseptal wall (though technically difficult study).  This could be a lesion from cardiac sarcoidosis.  - Cardiac PET (4/25): FDG uptake in the lateral wall base=>apex, basal inferior wall, and anterolateral papillary muscle, perfusion normal.  Also mediastinal FDG uptake noted in hilar nodes. This study is suggestive of active inflammation possibly from cardiac sarcoidosis.  EF 70%.  2. OSA: Uses CPAP.  3. Obesity 4. Asthma 5. ABIs normal in 9/21. 6. HTN 7. Hyperlipidemia 8. CAD: LHC (8/10) with tortuous LAD, 95% mid-distal LAD stenosis.  Repeat cath for [facility] showed only 50% proximal-mid LAD stenosis, unable to complete FFR as unable to wire vessel due to tortuosity.  - Cardiolite (3/24): No ischemia/infarction.  - Echo (4/24): EF 55-60%, normal RV.   FH: Aunt with sarcoidosis.   ROS: All systems reviewed and negative except as per HPI.   Social History   Socioeconomic History   Marital status: Married    Spouse name: Not on file   Number of children: Not on  file   Years of education: Not on file   Highest education level: Not on file  Occupational History   Not on file  Tobacco Use   Smoking status: Never   Smokeless tobacco: Never  Vaping Use   Vaping status: Never Used  Substance and Sexual Activity   Alcohol use: No   Drug use: No   Sexual activity: Not on file  Other Topics  Concern   Not on file  Social History Narrative   Not on file   Social Drivers of Health   Financial Resource Strain: Low Risk  (09/24/2022)   Received from [facility]   Overall Financial Resource Strain (CARDIA)    Difficulty of Paying Living Expenses: Not hard at all  Food Insecurity: No Food Insecurity (09/24/2022)   Received from [facility]   Hunger Vital Sign    Worried About Running Out of Food in the Last Year: Never true    Ran Out of Food in the Last Year: Never true  Transportation Needs: No Transportation Needs (09/24/2022)   Received from [facility] - Transportation    Lack of Transportation (Medical): No    Lack of Transportation (Non-Medical): No  Physical Activity: Not on file  Stress: Not on file  Social Connections: Unknown (11/30/2021)   Received from [facility], [facility]   Social Network    Social Network: Not on file  Intimate Partner Violence: Unknown (11/03/2021)   Received from [facility], [facility]   HITS    Physically Hurt: Not on file    Insult or Talk Down To: Not on file    Threaten Physical Harm: Not on file    Scream or Curse: Not on file    Current Outpatient Medications  Medication Sig Dispense Refill   ACETAMINOPHEN EXTRA STRENGTH 500 MG tablet Take 1,000 mg by mouth every 8 (eight) hours as needed.     Albuterol Sulfate (PROVENTIL HFA IN) Inhale 2 puffs into the lungs every other day as needed.     aspirin EC 81 MG tablet Take 81 mg by mouth daily.     atorvastatin (LIPITOR) 40 MG tablet Take 40 mg by mouth daily.      Calcium  200 MG TABS Take 6 tablets (1,200 mg total) by mouth daily.     celecoxib (CELEBREX) 100 MG capsule Take 100 mg by mouth 2 (two) times daily.     cholecalciferol (VITAMIN D) 1000 units tablet Take 1,000 Units by mouth daily.     fluticasone (FLONASE) 50 MCG/ACT nasal spray as needed.     folic acid  (FOLVITE ) 1 MG tablet Take 1 tablet (1 mg total) by mouth daily. 90 tablet 3    Glucos-Chondroit-Hyaluron-MSM (GLUCOSAMINE CHONDROITIN JOINT PO) Take by mouth daily.     isosorbide mononitrate (IMDUR) 60 MG 24 hr tablet Take 60 mg by mouth daily.     lisinopril (PRINIVIL,ZESTRIL) 20 MG tablet Take 20 mg by mouth daily.      methotrexate  (RHEUMATREX) 2.5 MG tablet Take 10 mg (four tablets) once weekly for 2 weeks, then 12.5 mg (5 tablets) once weekly for 2 weeks, then as instructed by heart failure clinic 18 tablet 1   metoprolol tartrate (LOPRESSOR) 100 MG tablet Take 100 mg by mouth 2 (two) times daily.      Multiple Vitamin (DAILY VITE PO) Take 1 tablet by mouth daily.     nitroGLYCERIN (NITROSTAT) 0.4 MG SL tablet      NON FORMULARY cpap  device     omeprazole  (PRILOSEC) 20 MG capsule Take 1 capsule (20 mg total) by mouth daily. 30 capsule 11   predniSONE  (DELTASONE ) 10 MG tablet Take prednisone  30 mg daily, then as directed by heart failure clinic 90 tablet 2   sulfamethoxazole -trimethoprim  (BACTRIM  DS) 800-160 MG tablet Take 1 tablet by mouth every Monday, Wednesday, and Friday. 12 tablet 3   tirzepatide  (MOUNJARO ) 5 MG/0.5ML Pen Inject 5 mg into the skin once a week. 2 mL 0   topiramate (TOPAMAX) 50 MG tablet Take 50 mg by mouth 2 (two) times daily.     Current Facility-Administered Medications  Medication Dose Route Frequency Provider Last Rate Last Admin   iron  sucrose (VENOFER ) 500 mg in sodium chloride  0.9 % 250 mL IVPB  500 mg Intravenous Once Rolan Ezra RAMAN, MD       Vitals:   01/21/24 0918  BP: 134/72  Pulse: (!) 103  SpO2: 94%  Weight: (!) 350 lb 6 oz (158.9 kg)   Wt Readings from Last 3 Encounters:  01/21/24 (!) 350 lb 6 oz (158.9 kg)  12/25/23 (!) 358 lb 8 oz (162.6 kg)  12/17/23 (!) 362 lb 3.2 oz (164.3 kg)   Lab Results  Component Value Date   CREATININE 1.43 (H) 12/25/2023   CREATININE 0.97 11/25/2023   CREATININE 0.97 08/22/2023    Physical Exam:   General: Well appearing, obese. No resp difficulty HEENT: normal Neck: supple, no  JVD Cor: Regular rhythm, tachycardic (no meds yet today). No rubs, gallops or murmurs Lungs: clear Abdomen: soft, nontender, nondistended. Extremities: no cyanosis, clubbing, rash, 1+ pitting edema bilateral shins Neuro: alert & oriented X 3. Moves all 4 extremities w/o difficulty. Affect pleasant   Assessment & Plan:  1. Anemia: Had 2 units PRBC's & iron  infusion at last visit 05/25.  She is not very symptomatic.  WBCs and platelets are not low, so I think this is less likely a hematologic effect of methotrexate .  She has been having heavy vaginal bleeding, she says that she still has her menses.  - had pelvic u/s done last week and is waiting for PCP appt - daughter is going to call PCP today if they don't hear from them 2. Cardiac sarcoidosis: Patient has biopsy-proven sarcoidosis from a mediastinal node biopsy in 2008. Cardiac MRI in 3/25 showed EF 66%, mid-wall LGE in the basal anteroseptal wall (though technically difficult study).  The LGE could be a lesion from cardiac sarcoidosis.  As a followup from this, cardiac PET was ordered.  This showed FDG uptake in the lateral wall base=>apex, basal inferior wall, and anterolateral papillary muscle, perfusion normal.  Also mediastinal FDG uptake noted in hilar nodes. This study is suggestive of active inflammation most likely from cardiac sarcoidosis, hilar nodes also involved.  Last CT chest in 2019 was unremarkable.  I suspect that the patient has active cardiac involvement by sarcoidosis.  No evidence for arrhythmias.  She has been started on methotrexate  + prednisone  and is now on Bactrim  prophylaxis, meds are being titrated using our cardiac sarcoidosis protocol.  - Continue to adjust methotrexate  and prednisone  based on sarcoidosis protocol. CBC, CMP today - She will continue metoprolol tartrate 100mg  BID. Tachycardic this morning but she hasn't taken her meds yet today  - She has upcoming appt with [place] [facility] 07/25 for full evaluation  for active pulmonary sarcoidosis.  3. OSA: Continue CPAP.  4. Morbid obesity: She has decided against bariatric surgery for now.  - Continue tirzepatide   5mg  weekly.  - weight down 8 pounds from last visit here 05/25 5.  CAD: LHC (8/10) with tortuous LAD, 95% mid-distal LAD stenosis.  Repeat cath for [facility] showed only 50% proximal-mid LAD stenosis, unable to complete FFR as unable to wire vessel due to tortuosity. Cardiolite in 3/24 with no ischemia/infarction. No chest pain.  - Continue ASA 81 daily.  - Continue atorvastatin 40 daily, good lipids in 1/25.  6. HTN: - BP 134/72 but no meds taken yet today - hydrochlorothiazide stopped 05/25 - Continue lisinopril 20mg  daily   Return in 2 months to see HF MD, sooner if needed.   Ellouise DELENA Class, FNP 01/20/24

## 2024-01-20 NOTE — Telephone Encounter (Signed)
 Called to confirm/remind patient of their appointment at the Advanced Heart Failure Clinic on 01/21/24.   Appointment:   [x] Confirmed  [] Left mess   [] No answer/No voice mail  [] VM Full/unable to leave message  [] Phone not in service  Patient reminded to bring all medications and/or complete list.  Confirmed patient has transportation. Gave directions, instructed to utilize valet parking.

## 2024-01-21 ENCOUNTER — Other Ambulatory Visit
Admission: RE | Admit: 2024-01-21 | Discharge: 2024-01-21 | Disposition: A | Discharge status: Home / Self Care | Source: Ambulatory Visit | Attending: Family | Admitting: Family

## 2024-01-21 ENCOUNTER — Other Ambulatory Visit: Payer: Self-pay

## 2024-01-21 ENCOUNTER — Encounter: Payer: Self-pay | Admitting: Family

## 2024-01-21 ENCOUNTER — Ambulatory Visit: Admitting: Family

## 2024-01-21 ENCOUNTER — Ambulatory Visit: Payer: Self-pay | Admitting: Family

## 2024-01-21 VITALS — BP 134/72 | HR 103 | Wt 350.4 lb

## 2024-01-21 DIAGNOSIS — I10 Essential (primary) hypertension: Secondary | ICD-10-CM | POA: Diagnosis present

## 2024-01-21 DIAGNOSIS — I25118 Atherosclerotic heart disease of native coronary artery with other forms of angina pectoris: Secondary | ICD-10-CM | POA: Insufficient documentation

## 2024-01-21 DIAGNOSIS — D869 Sarcoidosis, unspecified: Secondary | ICD-10-CM | POA: Diagnosis present

## 2024-01-21 DIAGNOSIS — D649 Anemia, unspecified: Secondary | ICD-10-CM | POA: Diagnosis not present

## 2024-01-21 DIAGNOSIS — G4733 Obstructive sleep apnea (adult) (pediatric): Secondary | ICD-10-CM

## 2024-01-21 LAB — CBC
HCT: 33.9 % — ABNORMAL LOW (ref 36.0–46.0)
Hemoglobin: 10.5 g/dL — ABNORMAL LOW (ref 12.0–15.0)
MCH: 29.3 pg (ref 26.0–34.0)
MCHC: 31 g/dL (ref 30.0–36.0)
MCV: 94.7 fL (ref 80.0–100.0)
Platelets: 167 10*3/uL (ref 150–400)
RBC: 3.58 MIL/uL — ABNORMAL LOW (ref 3.87–5.11)
RDW: 19.7 % — ABNORMAL HIGH (ref 11.5–15.5)
WBC: 15.1 10*3/uL — ABNORMAL HIGH (ref 4.0–10.5)
nRBC: 0 % (ref 0.0–0.2)

## 2024-01-21 LAB — COMPREHENSIVE METABOLIC PANEL WITH GFR
ALT: 25 U/L (ref 0–44)
AST: 18 U/L (ref 15–41)
Albumin: 3.8 g/dL (ref 3.5–5.0)
Alkaline Phosphatase: 67 U/L (ref 38–126)
Anion gap: 4 — ABNORMAL LOW (ref 5–15)
BUN: 34 mg/dL — ABNORMAL HIGH (ref 6–20)
CO2: 27 mmol/L (ref 22–32)
Calcium: 9.7 mg/dL (ref 8.9–10.3)
Chloride: 112 mmol/L — ABNORMAL HIGH (ref 98–111)
Creatinine, Ser: 1.21 mg/dL — ABNORMAL HIGH (ref 0.44–1.00)
GFR, Estimated: 52 mL/min — ABNORMAL LOW (ref >60)
Glucose, Bld: 110 mg/dL — ABNORMAL HIGH (ref 70–99)
Potassium: 4.1 mmol/L (ref 3.5–5.1)
Sodium: 143 mmol/L (ref 135–145)
Total Bilirubin: 0.2 mg/dL (ref 0.0–1.2)
Total Protein: 6.8 g/dL (ref 6.5–8.1)

## 2024-01-21 NOTE — Patient Instructions (Signed)
 Medication Changes:  No medication changes.   Lab Work:  Go over to the [facility]. Go pass the gift shop and have your blood work completed.  We will only call you if the results are abnormal or if the provider would like to make medication changes.   Follow-Up in: Dr. Rolan in August.  At the Advanced Heart Failure Clinic, you and your health needs are our priority. We have a designated team specialized in the treatment of Heart Failure. This Care Team includes your primary Heart Failure Specialized Cardiologist (physician), Advanced Practice Providers (APPs- Physician Assistants and Nurse Practitioners), and Pharmacist who all work together to provide you with the care you need, when you need it.   You may see any of the following providers on your designated Care Team at your next follow up:  Dr. Toribio Fuel Dr. Ezra Rolan Dr. Ria Commander Dr. Odis Brownie Ellouise Class, FNP Jaun Bash, RPH-CPP  Please be sure to bring in all your medications bottles to every appointment.   Need to Contact Us :  If you have any questions or concerns before your next appointment please send us  a message through [place] or call our office at [phone].    TO LEAVE A MESSAGE FOR THE NURSE SELECT OPTION 2, PLEASE LEAVE A MESSAGE INCLUDING: YOUR NAME DATE OF BIRTH CALL BACK NUMBER REASON FOR CALL**this is important as we prioritize the call backs  YOU WILL RECEIVE A CALL BACK THE SAME DAY AS LONG AS YOU CALL BEFORE 4:00 PM

## 2024-01-27 ENCOUNTER — Telehealth (HOSPITAL_COMMUNITY): Payer: Self-pay

## 2024-01-27 ENCOUNTER — Encounter (HOSPITAL_COMMUNITY): Payer: Self-pay

## 2024-01-27 ENCOUNTER — Other Ambulatory Visit: Payer: Self-pay | Admitting: Cardiology

## 2024-01-27 ENCOUNTER — Other Ambulatory Visit: Payer: Self-pay

## 2024-01-27 DIAGNOSIS — E119 Type 2 diabetes mellitus without complications: Secondary | ICD-10-CM

## 2024-01-27 MED ORDER — MOUNJARO 5 MG/0.5ML ~~LOC~~ SOAJ
5.0000 mg | SUBCUTANEOUS | 0 refills | Status: DC
  Filled 2024-01-27: qty 2, 28d supply, fill #0

## 2024-01-28 DEATH — deceased

## 2024-01-29 ENCOUNTER — Encounter (HOSPITAL_COMMUNITY): Admission: RE | Admit: 2024-01-29 | Source: Ambulatory Visit

## 2024-02-03 NOTE — Progress Notes (Deleted)
 Glenda Burton, female    DOB: 1965/11/18   MRN: [id number]   Brief patient profile:  58 yo***  *** referred to pulmonary clinic 02/05/2024 by *** for ***      Pt not previously seen by PCCM service.    History of Present Illness  02/05/2024  Pulmonary/ 1st office eval/[name]  No chief complaint on file.    Dyspnea:  *** Cough: *** Sleep: *** SABA use: *** 02 use:*** LDSCT:***  No obvious day to day or daytime pattern/variability or assoc excess/ purulent sputum or mucus plugs or hemoptysis or cp or chest tightness, subjective wheeze or overt sinus or hb symptoms.    Also denies any obvious fluctuation of symptoms with weather or environmental changes or other aggravating or alleviating factors except as outlined above   No unusual exposure hx or h/o childhood pna/ asthma or knowledge of premature birth.  Current Allergies, Complete Past Medical History, Past Surgical History, Family History, and Social History were reviewed in Glenda Burton.  ROS  The following are not active complaints unless bolded Hoarseness, sore throat, dysphagia, dental problems, itching, sneezing,  nasal congestion or discharge of excess mucus or purulent secretions, ear ache,   fever, chills, sweats, unintended wt loss or wt gain, classically pleuritic or exertional cp,  orthopnea pnd or arm/hand swelling  or leg swelling, presyncope, palpitations, abdominal pain, anorexia, nausea, vomiting, diarrhea  or change in bowel habits or change in bladder habits, change in stools or change in urine, dysuria, hematuria,  rash, arthralgias, visual complaints, headache, numbness, weakness or ataxia or problems with walking or coordination,  change in mood or  memory.             Outpatient Medications Prior to Visit  Medication Sig Dispense Refill   ACETAMINOPHEN EXTRA STRENGTH 500 MG tablet Take 1,000 mg by mouth every 8 (eight) hours as needed.     Albuterol Sulfate (PROVENTIL HFA IN)  Inhale 2 puffs into the lungs every other day as needed.     aspirin EC 81 MG tablet Take 81 mg by mouth daily.     atorvastatin (LIPITOR) 40 MG tablet Take 40 mg by mouth daily.      Calcium  200 MG TABS Take 6 tablets (1,200 mg total) by mouth daily.     celecoxib (CELEBREX) 100 MG capsule Take 100 mg by mouth 2 (two) times daily.     cholecalciferol (VITAMIN D) 1000 units tablet Take 1,000 Units by mouth daily.     fluticasone (FLONASE) 50 MCG/ACT nasal spray as needed.     folic acid  (FOLVITE ) 1 MG tablet Take 1 tablet (1 mg total) by mouth daily. 90 tablet 3   Glucos-Chondroit-Hyaluron-MSM (GLUCOSAMINE CHONDROITIN JOINT PO) Take by mouth daily.     isosorbide mononitrate (IMDUR) 60 MG 24 hr tablet Take 60 mg by mouth daily.     lisinopril (PRINIVIL,ZESTRIL) 20 MG tablet Take 20 mg by mouth daily.      methotrexate  (RHEUMATREX) 2.5 MG tablet Take 15 mg (six tablets) once weekly for 2 weeks, then 20 mg (8 tablets) once weekly 30 tablet 1   metoprolol tartrate (LOPRESSOR) 100 MG tablet Take 100 mg by mouth 2 (two) times daily.      Multiple Vitamin (DAILY VITE PO) Take 1 tablet by mouth daily.     nitroGLYCERIN (NITROSTAT) 0.4 MG SL tablet      NON FORMULARY cpap device     omeprazole  (PRILOSEC) 20 MG capsule Take  1 capsule (20 mg total) by mouth daily. 30 capsule 11   predniSONE  (DELTASONE ) 10 MG tablet Take prednisone  30 mg daily, then as directed by heart failure clinic 90 tablet 2   sulfamethoxazole -trimethoprim  (BACTRIM  DS) 800-160 MG tablet Take 1 tablet by mouth every Monday, Wednesday, and Friday. 12 tablet 3   tirzepatide  (MOUNJARO ) 5 MG/0.5ML Pen Inject 5 mg into the skin once a week. 2 mL 0   topiramate (TOPAMAX) 50 MG tablet Take 50 mg by mouth 2 (two) times daily.     Facility-Administered Medications Prior to Visit  Medication Dose Route Frequency Provider Last Rate Last Admin   iron  sucrose (VENOFER ) 500 mg in sodium chloride  0.9 % 250 mL IVPB  500 mg Intravenous Once Rolan Ezra RAMAN, MD        Past Medical History:  Diagnosis Date   Arthritis    Coronary artery disease    Coronary artery disease 02/2009   Moderate/severe mid LAD stenosis - medical management advised   Diabetes (HCC)    Hyperlipidemia    Hypertension    Sarcoid    Sleep apnea    Sleep apnea       Objective:     There were no vitals taken for this visit.         Assessment   No problem-specific Assessment & Plan notes found for this encounter.     [name], MD 02/03/2024

## 2024-02-05 ENCOUNTER — Encounter: Payer: Self-pay | Admitting: Internal Medicine

## 2024-02-05 ENCOUNTER — Ambulatory Visit: Admitting: Internal Medicine

## 2024-02-11 ENCOUNTER — Telehealth (HOSPITAL_COMMUNITY): Payer: Self-pay | Admitting: Emergency Medicine

## 2024-02-11 ENCOUNTER — Other Ambulatory Visit: Payer: Self-pay

## 2024-02-13 ENCOUNTER — Encounter (HOSPITAL_COMMUNITY): Admission: RE | Admit: 2024-02-13 | Source: Ambulatory Visit

## 2024-02-28 NOTE — Telephone Encounter (Signed)
 Unable to leave vm Rockwell Alexandria RN Navigator Cardiac Imaging [facility] Heart and Vascular Services [phone] Office  [phone] Cell

## 2024-02-28 DEATH — deceased

## 2024-03-10 ENCOUNTER — Encounter: Admitting: Cardiology
